# Patient Record
Sex: Male | Born: 1955 | Race: White | Hispanic: No | Marital: Married | Smoking: Never smoker
Health system: Southern US, Community
[De-identification: ages and names within clinical notes are randomized; demographics above are authoritative.]

## PROBLEM LIST (undated history)

## (undated) DIAGNOSIS — N183 Chronic kidney disease, stage 3 (moderate): Secondary | ICD-10-CM

## (undated) DIAGNOSIS — D631 Anemia in chronic kidney disease: Secondary | ICD-10-CM

## (undated) DIAGNOSIS — L03211 Cellulitis of face: Secondary | ICD-10-CM

## (undated) DIAGNOSIS — N189 Chronic kidney disease, unspecified: Secondary | ICD-10-CM

## (undated) DIAGNOSIS — N529 Male erectile dysfunction, unspecified: Secondary | ICD-10-CM

## (undated) DIAGNOSIS — K429 Umbilical hernia without obstruction or gangrene: Secondary | ICD-10-CM

## (undated) DIAGNOSIS — N2581 Secondary hyperparathyroidism of renal origin: Secondary | ICD-10-CM

## (undated) DIAGNOSIS — K219 Gastro-esophageal reflux disease without esophagitis: Secondary | ICD-10-CM

## (undated) DIAGNOSIS — I2510 Atherosclerotic heart disease of native coronary artery without angina pectoris: Secondary | ICD-10-CM

## (undated) DIAGNOSIS — Z8709 Personal history of other diseases of the respiratory system: Secondary | ICD-10-CM

## (undated) DIAGNOSIS — I10 Essential (primary) hypertension: Secondary | ICD-10-CM

## (undated) DIAGNOSIS — Z87442 Personal history of urinary calculi: Secondary | ICD-10-CM

## (undated) DIAGNOSIS — Z85820 Personal history of malignant melanoma of skin: Secondary | ICD-10-CM

## (undated) DIAGNOSIS — N200 Calculus of kidney: Secondary | ICD-10-CM

## (undated) HISTORY — DX: Cellulitis of face: L03.211

## (undated) HISTORY — PX: MOHS SURGERY: SUR867

## (undated) HISTORY — PX: TONSILLECTOMY: SUR1361

## (undated) HISTORY — PX: COLONOSCOPY: SHX174

---

## 2008-12-11 HISTORY — PX: EXTRACORPOREAL SHOCK WAVE LITHOTRIPSY: SHX1557

## 2016-11-14 LAB — HM COLONOSCOPY

## 2017-06-14 NOTE — Progress Notes (Signed)
Subjective:    Patient ID: Joseph Oconnell, male    DOB: [birth year], 61 y.o.   MRN: [id number]  HPI He is here to establish with a new pcp.     H/o melanoma:  He goes to the dermatologist annually and needs to establish with one in the area.   He has a lesion on his left hand that may have been present for about one year.  It is hypopigmented and painful at times.  Rides a bike typically for exercise, but since just moving down here has not been exercising.   He drinks energy drinks and about 2-3 coffee day.   Hypertension: He is taking his medication daily. He is compliant with a low sodium diet.  He denies chest pain, palpitations, edema, shortness of breath and regular headaches. He typically exercises regularly, but not recently.      Medications and allergies reviewed with patient and updated if appropriate.  Patient Active Problem List   Diagnosis Date Noted  . Chronic kidney disease (CKD) 06/16/2017  . History of colon polyps 06/16/2017  . History of melanoma 06/15/2017  . Hypertension 06/15/2017   Allergies as of 06/15/2017   Not on File     Medication List       Accurate as of 06/15/17 11:59 PM. Always use your most recent med list.          amLODipine 2.5 MG tablet Commonly known as:  NORVASC   losartan-hydrochlorothiazide 100-25 MG tablet Commonly known as:  HYZAAR Take 1 tablet by mouth daily.   ranitidine 150 MG tablet Commonly known as:  ZANTAC Take 150 mg by mouth 2 (two) times daily.   XENICAL 120 MG capsule Generic drug:  orlistat take 1 capsule by mouth three times a day WITH EACH MAIN MEAL THAT CONTAINS FAT       Past Medical History:  Diagnosis Date  . Facial cellulitis     Past Surgical History:  Procedure Laterality Date  . MOHS SURGERY     melanoma  . TONSILLECTOMY      Social History   Social History  . Marital status: Married    Spouse name: Diane  . Number of children: 0  . Years of education: N/A   Social History Main  Topics  . Smoking status: Never Smoker  . Smokeless tobacco: Never Used  . Alcohol use Yes     Comment: 2 glasses / day  . Drug use: No  . Sexual activity: Not Asked   Other Topics Concern  . None   Social History Narrative  . None    Family History  Problem Relation Age of Onset  . Breast cancer Mother     Review of Systems  Constitutional: Negative for appetite change, chills, fatigue and fever.  Eyes: Negative for visual disturbance.  Respiratory: Negative for cough, shortness of breath and wheezing.   Cardiovascular: Negative for chest pain, palpitations and leg swelling.  Gastrointestinal: Negative for abdominal pain, blood in stool, constipation, diarrhea and nausea.       Gerd occ  Genitourinary: Negative for dysuria and hematuria.  Musculoskeletal: Positive for arthralgias (hips) and back pain (intermittent lower back).  Skin: Positive for color change.  Neurological: Negative for light-headedness and headaches.  Psychiatric/Behavioral: Negative for dysphoric mood. The patient is not nervous/anxious.        Objective:   Vitals:   06/15/17 1119  BP: 118/80  Pulse: (!) 59  Temp: 98.1 F (36.7 C)  Filed Weights   06/15/17 1119  Weight: 209 lb (94.8 kg)   Body mass index is 28.35 kg/m.  Wt Readings from Last 3 Encounters:  06/15/17 209 lb (94.8 kg)     Physical Exam Constitutional: He appears well-developed and well-nourished. No distress.  HENT:  Head: Normocephalic and atraumatic.  Right Ear: External ear normal.  Left Ear: External ear normal.  Mouth/Throat: Oropharynx is clear and moist.  Normal ear canals and TM b/l  Eyes: Conjunctivae and EOM are normal.  Neck: Neck supple. No tracheal deviation present. No thyromegaly present.  No carotid bruit  Cardiovascular: Normal rate, regular rhythm, normal heart sounds and intact distal pulses.   No murmur heard. Pulmonary/Chest: Effort normal and breath sounds normal. No respiratory distress. He  has no wheezes. He has no rales.  Abdominal: Soft. Bowel sounds are normal. He exhibits no distension. There is no tenderness.  Musculoskeletal: He exhibits no edema.  Lymphadenopathy:   He has no cervical adenopathy.  Skin: Skin is warm and dry. He is not diaphoretic.  Psychiatric: He has a normal mood and affect. His behavior is normal.         Assessment & Plan:   See Problem List for Assessment and Plan of chronic medical problems.

## 2017-06-15 ENCOUNTER — Encounter: Payer: Self-pay | Admitting: Internal Medicine

## 2017-06-15 ENCOUNTER — Ambulatory Visit (INDEPENDENT_AMBULATORY_CARE_PROVIDER_SITE_OTHER): Payer: BLUE CROSS/BLUE SHIELD | Admitting: Internal Medicine

## 2017-06-15 VITALS — BP 118/80 | HR 59 | Temp 98.1°F | Ht 72.0 in | Wt 209.0 lb

## 2017-06-15 DIAGNOSIS — L989 Disorder of the skin and subcutaneous tissue, unspecified: Secondary | ICD-10-CM

## 2017-06-15 DIAGNOSIS — N183 Chronic kidney disease, stage 3 unspecified: Secondary | ICD-10-CM

## 2017-06-15 DIAGNOSIS — Z85820 Personal history of malignant melanoma of skin: Secondary | ICD-10-CM | POA: Diagnosis not present

## 2017-06-15 DIAGNOSIS — I10 Essential (primary) hypertension: Secondary | ICD-10-CM

## 2017-06-15 NOTE — Patient Instructions (Signed)
   Medications reviewed and updated.  No changes recommended at this time.    A referral was ordered for dermatology and nephrology.

## 2017-06-16 ENCOUNTER — Encounter: Payer: Self-pay | Admitting: Internal Medicine

## 2017-06-16 DIAGNOSIS — Z860100 Personal history of colon polyps, unspecified: Secondary | ICD-10-CM

## 2017-06-16 DIAGNOSIS — N183 Chronic kidney disease, stage 3 unspecified: Secondary | ICD-10-CM

## 2017-06-16 DIAGNOSIS — N189 Chronic kidney disease, unspecified: Secondary | ICD-10-CM | POA: Insufficient documentation

## 2017-06-16 HISTORY — DX: Chronic kidney disease, stage 3 unspecified: N18.30

## 2017-06-16 HISTORY — DX: Personal history of colonic polyps: Z86.010

## 2017-06-16 HISTORY — DX: Personal history of colon polyps, unspecified: Z86.0100

## 2017-06-16 NOTE — Assessment & Plan Note (Signed)
BP well controlled Current regimen effective and well tolerated Continue current medications at current doses  

## 2017-06-16 NOTE — Assessment & Plan Note (Signed)
Needs to establish with derm - will refer Has new hypopigmented lesion on left posterior hand, painful at times, present for one year - referred to derm

## 2017-06-16 NOTE — Assessment & Plan Note (Signed)
Has been stable per patient Needs to establish with nephrology - will refer

## 2017-07-11 ENCOUNTER — Telehealth: Payer: Self-pay | Admitting: Emergency Medicine

## 2017-07-11 DIAGNOSIS — N183 Chronic kidney disease, stage 3 unspecified: Secondary | ICD-10-CM

## 2017-07-11 DIAGNOSIS — I10 Essential (primary) hypertension: Secondary | ICD-10-CM

## 2017-07-11 NOTE — Telephone Encounter (Signed)
ordered

## 2017-07-11 NOTE — Telephone Encounter (Signed)
LVM on mobile number with information to come into lab for kidney funct panel.

## 2017-07-11 NOTE — Telephone Encounter (Signed)
-----   Message from Lysle RubensLori D Bowers sent at 07/11/2017  9:02 AM EDT -----  Just following up on the msg below.  ----- Message ----- From: Reuel BoomBowers, Lori D Sent: 07/06/2017   8:27 AM To: Gypsy Loreaylor B Wirt Hemmerich, CMA  Received msg from [facility] regarding pt's referral. Dr. Kathrene BongoGoldsborough wants labs ordered before they can rank pt - BMP or Renal panel.

## 2017-07-11 NOTE — Telephone Encounter (Signed)
Are you okay with ordering these labs, does he need any additional labs?

## 2017-07-20 NOTE — Telephone Encounter (Signed)
Spoke to pt's wife and pt will be coming in 8/14 or 15 to get labs done

## 2017-07-24 ENCOUNTER — Other Ambulatory Visit (INDEPENDENT_AMBULATORY_CARE_PROVIDER_SITE_OTHER): Payer: BLUE CROSS/BLUE SHIELD

## 2017-07-24 DIAGNOSIS — I10 Essential (primary) hypertension: Secondary | ICD-10-CM

## 2017-07-24 DIAGNOSIS — N183 Chronic kidney disease, stage 3 unspecified: Secondary | ICD-10-CM

## 2017-07-24 LAB — COMPREHENSIVE METABOLIC PANEL
ALT: 24 U/L (ref 0–53)
AST: 28 U/L (ref 0–37)
Albumin: 4.4 g/dL (ref 3.5–5.2)
Alkaline Phosphatase: 65 U/L (ref 39–117)
BUN: 19 mg/dL (ref 6–23)
CO2: 31 mEq/L (ref 19–32)
Calcium: 9.2 mg/dL (ref 8.4–10.5)
Chloride: 101 mEq/L (ref 96–112)
Creatinine, Ser: 1.36 mg/dL (ref 0.40–1.50)
GFR: 56.38 mL/min — ABNORMAL LOW (ref >60.00)
Glucose, Bld: 111 mg/dL — ABNORMAL HIGH (ref 70–99)
Potassium: 3.6 mEq/L (ref 3.5–5.1)
Sodium: 138 mEq/L (ref 135–145)
Total Bilirubin: 0.8 mg/dL (ref 0.2–1.2)
Total Protein: 6.7 g/dL (ref 6.0–8.3)

## 2017-08-21 ENCOUNTER — Telehealth: Payer: Self-pay | Admitting: Internal Medicine

## 2017-08-21 MED ORDER — XENICAL 120 MG PO CAPS: ORAL_CAPSULE | ORAL | 3 refills | Status: DC

## 2017-08-21 NOTE — Telephone Encounter (Signed)
Sent electronically.../lmb 

## 2017-08-21 NOTE — Telephone Encounter (Signed)
Requesting script for xenical

## 2017-08-21 NOTE — Telephone Encounter (Signed)
Pls advise since med has not been refill by you before...Joseph Oconnell/lmb

## 2017-08-21 NOTE — Telephone Encounter (Signed)
Ok to fill 

## 2017-08-28 ENCOUNTER — Telehealth: Payer: Self-pay | Admitting: Emergency Medicine

## 2017-08-28 NOTE — Telephone Encounter (Signed)
PA completed for Xenical. KEY [id number]. Awaiting Response.

## 2017-11-19 NOTE — Progress Notes (Signed)
Subjective:    Patient ID: Joseph Oconnell, male    DOB: 12/13/1955, 61 y.o.   MRN: [id number]  HPI He is here for a physical exam.   Right elbow pain:  When he twists his arm in certain ways it hurts.  It started two months ago.  He denies any specific injury.  He thinks it may be a tendon issue.    CKD - he sees his kidney doctor in Jan again.  He has seen derm.    He has no concerns.   Medications and allergies reviewed with patient and updated if appropriate.  Patient Active Problem List   Diagnosis Date Noted  . Chronic kidney disease (CKD) 06/16/2017  . History of colon polyps 06/16/2017  . History of melanoma 06/15/2017  . Hypertension 06/15/2017    Current Outpatient Medications on File Prior to Visit  Medication Sig Dispense Refill  . XENICAL 120 MG capsule take 1 capsule by mouth three times a day WITH EACH MAIN MEAL THAT CONTAINS FAT 90 capsule 3   No current facility-administered medications on file prior to visit.     Past Medical History:  Diagnosis Date  . Facial cellulitis     Past Surgical History:  Procedure Laterality Date  . MOHS SURGERY     melanoma  . TONSILLECTOMY      Social History   Socioeconomic History  . Marital status: Married    Spouse name: Diane  . Number of children: 0  . Years of education: None  . Highest education level: None  Social Needs  . Financial resource strain: None  . Food insecurity - worry: None  . Food insecurity - inability: None  . Transportation needs - medical: None  . Transportation needs - non-medical: None  Occupational History  . None  Tobacco Use  . Smoking status: Never Smoker  . Smokeless tobacco: Never Used  Substance and Sexual Activity  . Alcohol use: Yes    Comment: 2 glasses / day  . Drug use: No  . Sexual activity: None  Other Topics Concern  . None  Social History Narrative  . None    Family History  Problem Relation Age of Onset  . Breast cancer Mother     Review of Systems   Constitutional: Negative for chills and fever.  Eyes: Negative for visual disturbance.  Respiratory: Negative for cough, shortness of breath and wheezing.   Cardiovascular: Negative for chest pain, palpitations and leg swelling.  Gastrointestinal: Negative for abdominal pain, blood in stool, constipation, diarrhea and nausea.  Genitourinary: Negative for difficulty urinating, dysuria and hematuria.  Musculoskeletal: Positive for arthralgias (joint pain, knee pain). Negative for back pain.  Skin: Negative for color change and rash.  Neurological: Negative for dizziness, weakness, light-headedness, numbness and headaches.  Psychiatric/Behavioral: Negative for dysphoric mood. The patient is not nervous/anxious.        Objective:   Vitals:   11/22/17 0901  BP: 130/84  Pulse: 61  Resp: 16  Temp: 98 F (36.7 C)  SpO2: 97%   Filed Weights   11/22/17 0901  Weight: 212 lb (96.2 kg)   Body mass index is 28.75 kg/m.  Wt Readings from Last 3 Encounters:  11/22/17 212 lb (96.2 kg)  06/15/17 209 lb (94.8 kg)     Physical Exam Constitutional: He appears well-developed and well-nourished. No distress.  HENT:  Head: Normocephalic and atraumatic.  Right Ear: External ear normal.  Left Ear: External ear normal.  Mouth/Throat: Oropharynx  is clear and moist.  Normal ear canals and TM b/l  Eyes: Conjunctivae and EOM are normal.  Neck: Neck supple. No tracheal deviation present. No thyromegaly present.  No carotid bruit  Cardiovascular: Normal rate, regular rhythm, normal heart sounds and intact distal pulses.   No murmur heard. Pulmonary/Chest: Effort normal and breath sounds normal. No respiratory distress. He has no wheezes. He has no rales.  Abdominal: Soft. Umbilical hernia - non tender, reducible.  He exhibits no distension. There is no tenderness.  Genitourinary: deferred  Musculoskeletal: He exhibits no edema.  Lymphadenopathy:   He has no cervical adenopathy.  Skin: Skin is  warm and dry. He is not diaphoretic.  Psychiatric: He has a normal mood and affect. His behavior is normal.         Assessment & Plan:   Physical exam: Screening blood work  ordered Immunizations   Td up to date, flu today, on shingles list Colonoscopy  Done 2018 - due in 5 years Eye exams   Up to date  EKG - none on file Exercise  Not regular Weight   Working on weight loss  Skin no concerns, sees derm annually Substance abuse  none  See Problem List for Assessment and Plan of chronic medical problems.    FU in 1 year

## 2017-11-19 NOTE — Patient Instructions (Addendum)
Test(s) ordered today. Your results will be released to MyChart (or called to you) after review, usually within 72hours after test completion. If any changes need to be made, you will be notified at that same time.  All other Health Maintenance issues reviewed.   All recommended immunizations and age-appropriate screenings are up-to-date or discussed.  Flu immunization administered today.     Medications reviewed and updated.  No changes recommended at this time.  Your prescription(s) have been submitted to your pharmacy. Please take as directed and contact our office if you believe you are having problem(s) with the medication(s).  A referral was ordered for urology  Please followup in one year   Health Maintenance, Male A healthy lifestyle and preventive care is important for your health and wellness. Ask your health care provider about what schedule of regular examinations is right for you. What should I know about weight and diet? Eat a Healthy Diet  Eat plenty of vegetables, fruits, whole grains, low-fat dairy products, and lean protein.  Do not eat a lot of foods high in solid fats, added sugars, or salt.  Maintain a Healthy Weight Regular exercise can help you achieve or maintain a healthy weight. You should:  Do at least 150 minutes of exercise each week. The exercise should increase your heart rate and make you sweat (moderate-intensity exercise).  Do strength-training exercises at least twice a week.  Watch Your Levels of Cholesterol and Blood Lipids  Have your blood tested for lipids and cholesterol every 5 years starting at 62 years of age. If you are at high risk for heart disease, you should start having your blood tested when you are 62 years old. You may need to have your cholesterol levels checked more often if: ? Your lipid or cholesterol levels are high. ? You are older than 62 years of age. ? You are at high risk for heart disease.  What should I know about  cancer screening? Many types of cancers can be detected early and may often be prevented. Lung Cancer  You should be screened every year for lung cancer if: ? You are a current smoker who has smoked for at least 30 years. ? You are a former smoker who has quit within the past 15 years.  Talk to your health care provider about your screening options, when you should start screening, and how often you should be screened.  Colorectal Cancer  Routine colorectal cancer screening usually begins at 62 years of age and should be repeated every 5-10 years until you are 62 years old. You may need to be screened more often if early forms of precancerous polyps or small growths are found. Your health care provider may recommend screening at an earlier age if you have risk factors for colon cancer.  Your health care provider may recommend using home test kits to check for hidden blood in the stool.  A small camera at the end of a tube can be used to examine your colon (sigmoidoscopy or colonoscopy). This checks for the earliest forms of colorectal cancer.  Prostate and Testicular Cancer  Depending on your age and overall health, your health care provider may do certain tests to screen for prostate and testicular cancer.  Talk to your health care provider about any symptoms or concerns you have about testicular or prostate cancer.  Skin Cancer  Check your skin from head to toe regularly.  Tell your health care provider about any new moles or changes in  moles, especially if: ? There is a change in a mole's size, shape, or color. ? You have a mole that is larger than a pencil eraser.  Always use sunscreen. Apply sunscreen liberally and repeat throughout the day.  Protect yourself by wearing long sleeves, pants, a wide-brimmed hat, and sunglasses when outside.  What should I know about heart disease, diabetes, and high blood pressure?  If you are 11-100 years of age, have your blood pressure  checked every 3-5 years. If you are 60 years of age or older, have your blood pressure checked every year. You should have your blood pressure measured twice-once when you are at a hospital or clinic, and once when you are not at a hospital or clinic. Record the average of the two measurements. To check your blood pressure when you are not at a hospital or clinic, you can use: ? An automated blood pressure machine at a pharmacy. ? A home blood pressure monitor.  Talk to your health care provider about your target blood pressure.  If you are between 73-75 years old, ask your health care provider if you should take aspirin to prevent heart disease.  Have regular diabetes screenings by checking your fasting blood sugar level. ? If you are at a normal weight and have a low risk for diabetes, have this test once every three years after the age of 66. ? If you are overweight and have a high risk for diabetes, consider being tested at a younger age or more often.  A one-time screening for abdominal aortic aneurysm (AAA) by ultrasound is recommended for men aged 53-75 years who are current or former smokers. What should I know about preventing infection? Hepatitis B If you have a higher risk for hepatitis B, you should be screened for this virus. Talk with your health care provider to find out if you are at risk for hepatitis B infection. Hepatitis C Blood testing is recommended for:  Everyone born from [birth year] through 1966.  Anyone with known risk factors for hepatitis C.  Sexually Transmitted Diseases (STDs)  You should be screened each year for STDs including gonorrhea and chlamydia if: ? You are sexually active and are younger than 62 years of age. ? You are older than 62 years of age and your health care provider tells you that you are at risk for this type of infection. ? Your sexual activity has changed since you were last screened and you are at an increased risk for chlamydia or gonorrhea.  Ask your health care provider if you are at risk.  Talk with your health care provider about whether you are at high risk of being infected with HIV. Your health care provider may recommend a prescription medicine to help prevent HIV infection.  What else can I do?  Schedule regular health, dental, and eye exams.  Stay current with your vaccines (immunizations).  Do not use any tobacco products, such as cigarettes, chewing tobacco, and e-cigarettes. If you need help quitting, ask your health care provider.  Limit alcohol intake to no more than 2 drinks per day. One drink equals 12 ounces of beer, 5 ounces of wine, or 1 ounces of hard liquor.  Do not use street drugs.  Do not share needles.  Ask your health care provider for help if you need support or information about quitting drugs.  Tell your health care provider if you often feel depressed.  Tell your health care provider if you have ever been abused  or do not feel safe at home. This information is not intended to replace advice given to you by your health care provider. Make sure you discuss any questions you have with your health care provider. Document Released: 05/25/2008 Document Revised: 07/26/2016 Document Reviewed: 08/31/2015 Elsevier Interactive Patient Education  [employer].

## 2017-11-22 ENCOUNTER — Telehealth: Payer: Self-pay | Admitting: Internal Medicine

## 2017-11-22 ENCOUNTER — Encounter: Payer: Self-pay | Admitting: Internal Medicine

## 2017-11-22 ENCOUNTER — Ambulatory Visit (INDEPENDENT_AMBULATORY_CARE_PROVIDER_SITE_OTHER): Payer: BLUE CROSS/BLUE SHIELD | Admitting: Internal Medicine

## 2017-11-22 ENCOUNTER — Other Ambulatory Visit (INDEPENDENT_AMBULATORY_CARE_PROVIDER_SITE_OTHER): Payer: BLUE CROSS/BLUE SHIELD

## 2017-11-22 VITALS — BP 130/84 | HR 61 | Temp 98.0°F | Resp 16 | Wt 212.0 lb

## 2017-11-22 DIAGNOSIS — Z125 Encounter for screening for malignant neoplasm of prostate: Secondary | ICD-10-CM

## 2017-11-22 DIAGNOSIS — Z13820 Encounter for screening for osteoporosis: Secondary | ICD-10-CM | POA: Insufficient documentation

## 2017-11-22 DIAGNOSIS — I10 Essential (primary) hypertension: Secondary | ICD-10-CM | POA: Diagnosis not present

## 2017-11-22 DIAGNOSIS — N183 Chronic kidney disease, stage 3 unspecified: Secondary | ICD-10-CM

## 2017-11-22 DIAGNOSIS — Z0001 Encounter for general adult medical examination with abnormal findings: Secondary | ICD-10-CM | POA: Diagnosis not present

## 2017-11-22 DIAGNOSIS — Z85820 Personal history of malignant melanoma of skin: Secondary | ICD-10-CM

## 2017-11-22 DIAGNOSIS — K429 Umbilical hernia without obstruction or gangrene: Secondary | ICD-10-CM

## 2017-11-22 DIAGNOSIS — Z23 Encounter for immunization: Secondary | ICD-10-CM | POA: Diagnosis not present

## 2017-11-22 LAB — LIPID PANEL
Cholesterol: 145 mg/dL (ref 0–200)
HDL: 45.3 mg/dL (ref >39.00)
LDL Cholesterol: 72 mg/dL (ref 0–99)
NonHDL: 99.23
Total CHOL/HDL Ratio: 3
Triglycerides: 134 mg/dL (ref 0.0–149.0)
VLDL: 26.8 mg/dL (ref 0.0–40.0)

## 2017-11-22 LAB — CBC WITH DIFFERENTIAL/PLATELET
Basophils Absolute: 0.1 10*3/uL (ref 0.0–0.1)
Basophils Relative: 0.9 % (ref 0.0–3.0)
Eosinophils Absolute: 0.7 10*3/uL (ref 0.0–0.7)
Eosinophils Relative: 9.2 % — ABNORMAL HIGH (ref 0.0–5.0)
HCT: 43.2 % (ref 39.0–52.0)
Hemoglobin: 15.3 g/dL (ref 13.0–17.0)
Lymphocytes Relative: 36.6 % (ref 12.0–46.0)
Lymphs Abs: 2.9 10*3/uL (ref 0.7–4.0)
MCHC: 35.4 g/dL (ref 30.0–36.0)
MCV: 99.4 fl (ref 78.0–100.0)
Monocytes Absolute: 0.7 10*3/uL (ref 0.1–1.0)
Monocytes Relative: 9.1 % (ref 3.0–12.0)
Neutro Abs: 3.5 10*3/uL (ref 1.4–7.7)
Neutrophils Relative %: 44.2 % (ref 43.0–77.0)
Platelets: 143 10*3/uL — ABNORMAL LOW (ref 150.0–400.0)
RBC: 4.34 Mil/uL (ref 4.22–5.81)
RDW: 13.3 % (ref 11.5–15.5)
WBC: 7.9 10*3/uL (ref 4.0–10.5)

## 2017-11-22 LAB — COMPREHENSIVE METABOLIC PANEL
ALT: 26 U/L (ref 0–53)
AST: 24 U/L (ref 0–37)
Albumin: 4.2 g/dL (ref 3.5–5.2)
Alkaline Phosphatase: 56 U/L (ref 39–117)
BUN: 17 mg/dL (ref 6–23)
CO2: 31 mEq/L (ref 19–32)
Calcium: 8.9 mg/dL (ref 8.4–10.5)
Chloride: 104 mEq/L (ref 96–112)
Creatinine, Ser: 1.26 mg/dL (ref 0.40–1.50)
GFR: 61.51 mL/min (ref >60.00)
Glucose, Bld: 92 mg/dL (ref 70–99)
Potassium: 3.9 mEq/L (ref 3.5–5.1)
Sodium: 140 mEq/L (ref 135–145)
Total Bilirubin: 0.6 mg/dL (ref 0.2–1.2)
Total Protein: 7.2 g/dL (ref 6.0–8.3)

## 2017-11-22 LAB — TSH: TSH: 2.99 u[IU]/mL (ref 0.35–4.50)

## 2017-11-22 MED ORDER — AMLODIPINE BESYLATE 2.5 MG PO TABS: 2.5000 mg | ORAL_TABLET | Freq: Every day | ORAL | 3 refills | Status: DC

## 2017-11-22 MED ORDER — LOSARTAN POTASSIUM-HCTZ 100-25 MG PO TABS: 1.0000 | ORAL_TABLET | Freq: Every day | ORAL | 3 refills | Status: DC

## 2017-11-22 NOTE — Telephone Encounter (Signed)
I discussed this with pt and wife at discharge. Does a referral need to be entered for opthalmology

## 2017-11-22 NOTE — Assessment & Plan Note (Signed)
W/o symptoms No referral to surgery needed - will monitor

## 2017-11-22 NOTE — Assessment & Plan Note (Signed)
Up to date with dermatology visits  ?

## 2017-11-22 NOTE — Telephone Encounter (Signed)
Eye referral not needed - they can make their own appt.  [facility]rote done [facility]  On his wife's pt instructions as a good practice to check into

## 2017-11-22 NOTE — Telephone Encounter (Addendum)
FYI Patient states he was to be referred for urologist and opthalmology.

## 2017-11-22 NOTE — Telephone Encounter (Signed)
Advised pts wife to look back at the AVS for necessary information.

## 2017-11-22 NOTE — Assessment & Plan Note (Signed)
Following with [facility]el at [facility] cmp

## 2017-11-22 NOTE — Assessment & Plan Note (Signed)
Has CKD Concerned over bones - will order dexa Will pay for exam if not covered

## 2017-11-22 NOTE — Assessment & Plan Note (Signed)
BP well controlled Current regimen effective and well tolerated Continue current medications at current doses cmp  

## 2017-11-23 LAB — PSA, TOTAL AND FREE
PSA, % Free: 50 % (calc) (ref >25)
PSA, Free: 0.4 ng/mL
PSA, Total: 0.8 ng/mL (ref <=4.0)

## 2017-12-25 ENCOUNTER — Ambulatory Visit (INDEPENDENT_AMBULATORY_CARE_PROVIDER_SITE_OTHER): Payer: BLUE CROSS/BLUE SHIELD | Admitting: *Deleted

## 2017-12-25 ENCOUNTER — Ambulatory Visit (INDEPENDENT_AMBULATORY_CARE_PROVIDER_SITE_OTHER)
Admission: RE | Admit: 2017-12-25 | Discharge: 2017-12-25 | Disposition: A | Discharge status: Home / Self Care | Payer: BLUE CROSS/BLUE SHIELD | Source: Ambulatory Visit | Attending: Internal Medicine | Admitting: Internal Medicine

## 2017-12-25 DIAGNOSIS — Z23 Encounter for immunization: Secondary | ICD-10-CM

## 2017-12-25 DIAGNOSIS — Z13820 Encounter for screening for osteoporosis: Secondary | ICD-10-CM

## 2017-12-26 NOTE — Progress Notes (Signed)
[name] [address] [place], [place] [address] Phone: [phone] Subjective:    I'm seeing this patient by the request  of:  Pincus Sanes, MD   CC: right arm pain and knee pain   BJY:[id number]  Joseph Oconnell is a 62 y.o. male coming in with complaint of right arm and knee pain. States he tore his lateral meniscus years ago (1978). He says that his elbow feels like something is disconnected.  Elbow -Twisted it while moving - Pain radiates up arm -Plays golf  Onset- 2- 3 months ago Location- lateral Character- Sharp Aggravating factors- lifting (supinates to compensate)  Reliving factors-rest Therapies tried-  Severity-6 out of 10  Knee -Swelling -Twisting it wrong increases pain  Onset- Chronic Location- Lateral Duration-intermittent Character- Dull, achy, heavy Aggravating factors- climbing steps,  Reliving factors-time Therapies tried-nothing Severity-5 out of 10    Past Medical History:  Diagnosis Date  . Facial cellulitis    Past Surgical History:  Procedure Laterality Date  . MOHS SURGERY     melanoma  . TONSILLECTOMY     Social History   Socioeconomic History  . Marital status: Married    Spouse name: Joseph Oconnell  . Number of children: 0  . Years of education: None  . Highest education level: None  Social Needs  . Financial resource strain: None  . Food insecurity - worry: None  . Food insecurity - inability: None  . Transportation needs - medical: None  . Transportation needs - non-medical: None  Occupational History  . None  Tobacco Use  . Smoking status: Never Smoker  . Smokeless tobacco: Never Used  Substance and Sexual Activity  . Alcohol use: Yes    Comment: 2 glasses / day  . Drug use: No  . Sexual activity: None  Other Topics Concern  . None  Social History Narrative  . None   Not on File Family History  Problem Relation Age of Onset  . Breast cancer Mother      Past medical history,  social, surgical and family history all reviewed in electronic medical record.  No pertanent information unless stated regarding to the chief complaint.   Review of Systems:Review of systems updated and as accurate as of 12/27/17  No headache, visual changes, nausea, vomiting, diarrhea, constipation, dizziness, abdominal pain, skin rash, fevers, chills, night sweats, weight loss, swollen lymph nodes, body aches, joint swelling, chest pain, shortness of breath, mood changes.  Positive muscle aches  Objective  Blood pressure 118/80, pulse 75, height 6' (1.829 m), weight 209 lb (94.8 kg), SpO2 98 %. Systems examined below as of 12/27/17   General: No apparent distress alert and oriented x3 mood and affect normal, dressed appropriately.  HEENT: Pupils equal, extraocular movements intact  Respiratory: Patient's speak in full sentences and does not appear short of breath  Cardiovascular: No lower extremity edema, non tender, no erythema  Skin: Warm dry intact with no signs of infection or rash on extremities or on axial skeleton.  Abdomen: Soft nontender  Neuro: Cranial nerves II through XII are intact, neurovascularly intact in all extremities with 2+ DTRs and 2+ pulses.  Lymph: No lymphadenopathy of posterior or anterior cervical chain or axillae bilaterally.  Gait normal with good balance and coordination.  MSK:  Non tender with full range of motion and good stability and symmetric strength and tone of shoulders, wrist, hip,  and ankles bilaterally.  Elbow: Right Unremarkable to inspection. Range of motion full  pronation, supination, flexion, extension. Strength is full to all of the above directions Stable to varus, valgus stress. Negative moving valgus stress test. Tender over the lateral epicondylar region. Ulnar nerve does not sublux. Negative cubital tunnel Tinel's.  Knee: Right Normal to inspection with no erythema or effusion or obvious bony abnormalities. Palpation normal with no  warmth, joint line tenderness, patellar tenderness, or condyle tenderness. ROM full in flexion and extension and lower leg rotation. Ligaments with solid consistent endpoints including ACL, PCL, LCL, MCL. Negative Mcmurray's, Apley's, and Thessalonian tests. Mild painful patellar compression. Patellar glide to moderate crepitus. Patellar and quadriceps tendons unremarkable. Hamstring and quadriceps strength is normal.  Musculoskeletal ultrasound was performed and interpreted by Terrilee FilesZach Beverly Suriano D.O.   Elbow:  Lateral epicondyle and common extensor tendon origin visualized.  Chronic avulsion noted with increasing Doppler flow and hypoechoic changes.  Patient's medial epicondylar region is unremarkable.  Radial head is unremarkable.Marland Kitchen.  IMPRESSION: Avulsion fracture of the lateral epicondylar region  [address]; 15 additional minutes spent for Therapeutic exercises as stated in above notes.  This included exercises focusing on stretching, strengthening, with significant focus on eccentric aspects.   Long term goals include an improvement in range of motion, strength, endurance as well as avoiding reinjury. Patient's frequency would include in 1-2 times a day, 3-5 times a week for a duration of 6-12 weeks. Patellofemoral Syndrome  Reviewed anatomy using anatomical model and how PFS occurs.  Given rehab exercises handout for VMO, hip abductors, core, entire kinetic chain including proprioception exercises including cone touches, step downs, hip elevations and turn outs.  Could benefit from PT, regular exercise, upright biking, and a PFS knee brace to assist with tracking abnormalities.  Proper technique shown and discussed handout in great detail with ATC.  All questions were discussed and answered.     Impression and Recommendations:     This case required medical decision making of moderate complexity.      Note: This dictation was prepared with Dragon dictation along with smaller phrase  technology. Any transcriptional errors that result from this process are unintentional.

## 2017-12-27 ENCOUNTER — Ambulatory Visit: Payer: BLUE CROSS/BLUE SHIELD | Admitting: Family Medicine

## 2017-12-27 ENCOUNTER — Ambulatory Visit: Payer: Self-pay

## 2017-12-27 ENCOUNTER — Encounter: Payer: Self-pay | Admitting: Family Medicine

## 2017-12-27 ENCOUNTER — Other Ambulatory Visit: Payer: Self-pay

## 2017-12-27 VITALS — BP 118/80 | HR 75 | Ht 72.0 in | Wt 209.0 lb

## 2017-12-27 DIAGNOSIS — M25521 Pain in right elbow: Secondary | ICD-10-CM | POA: Diagnosis not present

## 2017-12-27 DIAGNOSIS — M1711 Unilateral primary osteoarthritis, right knee: Secondary | ICD-10-CM | POA: Diagnosis not present

## 2017-12-27 DIAGNOSIS — S42434A Nondisplaced fracture (avulsion) of lateral epicondyle of right humerus, initial encounter for closed fracture: Secondary | ICD-10-CM

## 2017-12-27 DIAGNOSIS — Z13820 Encounter for screening for osteoporosis: Secondary | ICD-10-CM | POA: Diagnosis not present

## 2017-12-27 DIAGNOSIS — S42433A Displaced fracture (avulsion) of lateral epicondyle of unspecified humerus, initial encounter for closed fracture: Secondary | ICD-10-CM

## 2017-12-27 HISTORY — DX: Unilateral primary osteoarthritis, right knee: M17.11

## 2017-12-27 HISTORY — DX: Displaced fracture (avulsion) of lateral epicondyle of unspecified humerus, initial encounter for closed fracture: S42.433A

## 2017-12-27 MED ORDER — DICLOFENAC SODIUM 2 % TD SOLN: 2.0000 g | Freq: Two times a day (BID) | TRANSDERMAL | 3 refills | Status: DC

## 2017-12-27 MED ORDER — VITAMIN D (ERGOCALCIFEROL) 1.25 MG (50000 UNIT) PO CAPS: 50000.0000 [IU] | ORAL_CAPSULE | ORAL | 0 refills | Status: DC

## 2017-12-27 NOTE — Patient Instructions (Signed)
Good to see you.  Ice 20 minutes 2 times daily. Usually after activity and before bed. Exercises 3 times a week. But start in a week  Wrist brace day and night for 2 weeks then nightly for 2 weeks.  Once weekly vitamin D for 12 weeks.  pennsaid pinkie amount topically 2 times daily as needed.   For the knee biking would be great  Look up exercises for VMO strengthening  See me again in 4 weeks

## 2017-12-27 NOTE — Assessment & Plan Note (Signed)
Patellofemoral Syndrome  Reviewed anatomy using anatomical model and how PFS occurs.  Given rehab exercises handout for VMO, hip abductors, core, entire kinetic chain including proprioception exercises including cone touches, step downs, hip elevations and turn outs.  Could benefit from PT, regular exercise, upright biking, and a PFS knee brace to assist with tracking abnormalities. Likely underlying arthritis as well.

## 2017-12-27 NOTE — Assessment & Plan Note (Signed)
Patient does have a nonhealing epicondylar fracture.  Given home exercises, icing regimen on anti-inflammatories and once weekly vitamin D.  Will avoid wrist extension.  Follow-up again in 4 weeks.

## 2018-01-23 NOTE — Progress Notes (Signed)
Joseph Oconnell D.O. [facility] [address] [place], [place] [address] Phone: [phone] Subjective:     CC: Joseph Oconnell  QMV:[id number]:Subjective  Joseph SidleDavid Oconnell is a 62 y.o. male coming in with complaint of knee and Joseph pain.   His knee is feeling better than last visit.   His Joseph was feeling better but his dog jumped up on him and he feels that he re-injured his Joseph. He also notes pain in his bicep. His pain is intermittent and occurs if he moves his wrist.  Patient wants to make sure that he does not have another tear.  Patient states that he is feeling significantly better.      Past Medical History:  Diagnosis Date  . Facial cellulitis    Past Surgical History:  Procedure Laterality Date  . MOHS SURGERY     melanoma  . TONSILLECTOMY     Social History   Socioeconomic History  . Marital status: Married    Spouse name: Joseph Oconnell  . Number of children: 0  . Years of education: None  . Highest education level: None  Social Needs  . Financial resource strain: None  . Food insecurity - worry: None  . Food insecurity - inability: None  . Transportation needs - medical: None  . Transportation needs - non-medical: None  Occupational History  . None  Tobacco Use  . Smoking status: Never Smoker  . Smokeless tobacco: Never Used  Substance and Sexual Activity  . Alcohol use: Yes    Comment: 2 glasses / day  . Drug use: No  . Sexual activity: None  Other Topics Concern  . None  Social History Narrative  . None   Not on File Family History  Problem Relation Age of Onset  . Breast cancer Mother      Past medical history, social, surgical and family history all reviewed in electronic medical record.  No pertanent information unless stated regarding to the chief complaint.   Review of Systems:Review of systems updated and as accurate as of 01/24/18  No headache, visual changes, nausea, vomiting, diarrhea, constipation, dizziness, abdominal pain, skin  rash, fevers, chills, night sweats, weight loss, swollen lymph nodes, body aches, joint swelling, muscle aches, chest pain, shortness of breath, mood changes.   Objective  Blood pressure (!) 132/92, pulse 77, height 6' (1.829 m), weight 208 lb (94.3 kg), SpO2 98 %. Systems examined below as of 01/24/18   General: No apparent distress alert and oriented x3 mood and affect normal, dressed appropriately.  HEENT: Pupils equal, extraocular movements intact  Respiratory: Patient's speak in full sentences and does not appear short of breath  Cardiovascular: No lower extremity edema, non tender, no erythema  Skin: Warm dry intact with no signs of infection or rash on extremities or on axial skeleton.  Abdomen: Soft nontender  Neuro: Cranial nerves II through XII are intact, neurovascularly intact in all extremities with 2+ DTRs and 2+ pulses.  Lymph: No lymphadenopathy of posterior or anterior cervical chain or axillae bilaterally.  Gait normal with good balance and coordination.  MSK:  Non tender with full range of motion and good stability and symmetric strength and tone of shoulders, , wrist, hip, knee and ankles bilaterally.  Joseph: Right Unremarkable to inspection. Range of motion full pronation, supination, flexion, extension. Strength is full to all of the above directions Stable to varus, valgus stress. Negative moving valgus stress test. Very mild discomfort over the lateral epicondylar region Ulnar nerve does  not sublux. Negative cubital tunnel Tinel's. Contralateral Joseph unremarkable  Musculoskeletal ultrasound was performed and interpreted by Terrilee Files D.O.   Joseph:  Lateral epicondyle and common extensor tendon origin visualized.  Shows a very small partial tear noted.  Patient no avulsion that was seen previously is well healing at this time.  IMPRESSION: Interval healing of lateral epicondylitis   Impression and Recommendations:     This case required medical decision  making of moderate complexity.      Note: This dictation was prepared with Dragon dictation along with smaller phrase technology. Any transcriptional errors that result from this process are unintentional.

## 2018-01-24 ENCOUNTER — Ambulatory Visit: Payer: BLUE CROSS/BLUE SHIELD | Admitting: Family Medicine

## 2018-01-24 ENCOUNTER — Encounter: Payer: Self-pay | Admitting: Family Medicine

## 2018-01-24 ENCOUNTER — Ambulatory Visit: Payer: Self-pay

## 2018-01-24 VITALS — BP 132/92 | HR 77 | Ht 72.0 in | Wt 208.0 lb

## 2018-01-24 DIAGNOSIS — S42434D Nondisplaced fracture (avulsion) of lateral epicondyle of right humerus, subsequent encounter for fracture with routine healing: Secondary | ICD-10-CM

## 2018-01-24 DIAGNOSIS — M25521 Pain in right elbow: Secondary | ICD-10-CM

## 2018-01-24 NOTE — Patient Instructions (Signed)
4 weeks

## 2018-01-24 NOTE — Assessment & Plan Note (Signed)
Avulsion seems to be healing.  Discussed continuing the vitamin D, icing regimen, bracing for 1 week.  Patient is to start considering the possibility of plain golf again in the near future.  Patient will come back in 4 weeks if not completely resolved

## 2018-02-21 ENCOUNTER — Ambulatory Visit: Payer: BLUE CROSS/BLUE SHIELD | Admitting: Family Medicine

## 2018-02-25 ENCOUNTER — Ambulatory Visit: Payer: BLUE CROSS/BLUE SHIELD

## 2018-03-12 NOTE — Progress Notes (Signed)
Joseph ScaleZach Shayne Oconnell D.O. [facility] [address] [place], [place] [address] Phone: [phone] Subjective:      CC: Right elbow pain follow-up  NUU:[id number]:Subjective  Joseph SidleDavid Oconnell is a 62 y.o. male coming in with complaint of right elbow pain. He is having pain up into the tricep. He continues to have sharp pain with "twisting." He has been doing a lot of yard work.  Seems to be more posterior than lateral now.  A little bit different.  Not severe.     Past Medical History:  Diagnosis Date  . Facial cellulitis    Past Surgical History:  Procedure Laterality Date  . MOHS SURGERY     melanoma  . TONSILLECTOMY     Social History   Socioeconomic History  . Marital status: Married    Spouse name: Diane  . Number of children: 0  . Years of education: Not on file  . Highest education level: Not on file  Occupational History  . Not on file  Social Needs  . Financial resource strain: Not on file  . Food insecurity:    Worry: Not on file    Inability: Not on file  . Transportation needs:    Medical: Not on file    Non-medical: Not on file  Tobacco Use  . Smoking status: Never Smoker  . Smokeless tobacco: Never Used  Substance and Sexual Activity  . Alcohol use: Yes    Comment: 2 glasses / day  . Drug use: No  . Sexual activity: Not on file  Lifestyle  . Physical activity:    Days per week: Not on file    Minutes per session: Not on file  . Stress: Not on file  Relationships  . Social connections:    Talks on phone: Not on file    Gets together: Not on file    Attends religious service: Not on file    Active member of club or organization: Not on file    Attends meetings of clubs or organizations: Not on file    Relationship status: Not on file  Other Topics Concern  . Not on file  Social History Narrative  . Not on file   Not on File Family History  Problem Relation Age of Onset  . Breast cancer Mother      Past medical history, social, surgical and  family history all reviewed in electronic medical record.  No pertanent information unless stated regarding to the chief complaint.   Review of Systems:Review of systems updated and as accurate as of 03/13/18  No headache, visual changes, nausea, vomiting, diarrhea, constipation, dizziness, abdominal pain, skin rash, fevers, chills, night sweats, weight loss, swollen lymph nodes, body aches, joint swelling, muscle aches, chest pain, shortness of breath, mood changes.   Objective  Blood pressure (!) 138/98, pulse 75, height 6' (1.829 m), weight 202 lb (91.6 kg), SpO2 98 %. Systems examined below as of 03/13/18   General: No apparent distress alert and oriented x3 mood and affect normal, dressed appropriately.  HEENT: Pupils equal, extraocular movements intact  Respiratory: Patient's speak in full sentences and does not appear short of breath  Cardiovascular: No lower extremity edema, non tender, no erythema  Skin: Warm dry intact with no signs of infection or rash on extremities or on axial skeleton.  Abdomen: Soft nontender  Neuro: Cranial nerves II through XII are intact, neurovascularly intact in all extremities with 2+ DTRs and 2+ pulses.  Lymph: No lymphadenopathy of posterior  or anterior cervical chain or axillae bilaterally.  Gait normal with good balance and coordination.  MSK:  Non tender with full range of motion and good stability and symmetric strength and tone of shoulders, wrist, hip, knee and ankles bilaterally.  Elbow: Right Unremarkable to inspection. Range of motion full pronation, supination, flexion, extension. Strength is full to all of the above directions Stable to varus, valgus stress. Negative moving valgus stress test. No discrete areas of tenderness to palpation. Ulnar nerve does not sublux. Negative cubital tunnel Tinel's. Contralateral elbow unremarkable  Musculoskeletal ultrasound was performed and interpreted by Terrilee Files D.O.   Elbow:  O.  Seems to have  significant improvement.  Bone seems to be showing reabsorption where avulsion used to be.  Still some mild increased Doppler flow.  Patient does have enlargement of the radial nerve.  IMPRESSION:  NORMAL ULTRASONOGRAPHIC EXAMINATION OF THE ELBOW.    Impression and Recommendations:     This case required medical decision making of moderate complexity.      Note: This dictation was prepared with Dragon dictation along with smaller phrase technology. Any transcriptional errors that result from this process are unintentional.

## 2018-03-13 ENCOUNTER — Ambulatory Visit: Payer: BLUE CROSS/BLUE SHIELD | Admitting: Family Medicine

## 2018-03-13 ENCOUNTER — Ambulatory Visit: Payer: Self-pay

## 2018-03-13 ENCOUNTER — Ambulatory Visit: Payer: BLUE CROSS/BLUE SHIELD

## 2018-03-13 ENCOUNTER — Ambulatory Visit (INDEPENDENT_AMBULATORY_CARE_PROVIDER_SITE_OTHER): Payer: BLUE CROSS/BLUE SHIELD

## 2018-03-13 ENCOUNTER — Encounter: Payer: Self-pay | Admitting: Family Medicine

## 2018-03-13 VITALS — BP 138/98 | HR 75 | Ht 72.0 in | Wt 202.0 lb

## 2018-03-13 DIAGNOSIS — M25521 Pain in right elbow: Secondary | ICD-10-CM

## 2018-03-13 DIAGNOSIS — S42434D Nondisplaced fracture (avulsion) of lateral epicondyle of right humerus, subsequent encounter for fracture with routine healing: Secondary | ICD-10-CM

## 2018-03-13 DIAGNOSIS — Z299 Encounter for prophylactic measures, unspecified: Secondary | ICD-10-CM

## 2018-03-13 MED ORDER — GABAPENTIN 100 MG PO CAPS: 200.0000 mg | ORAL_CAPSULE | Freq: Every day | ORAL | 3 refills | Status: DC

## 2018-03-13 NOTE — Patient Instructions (Addendum)
Good to see you  Joseph Oconnell is your friend.  Gabapentin 200mg  at night Stay active though  Arm compression sleeve from [employer] with activity could help  pennsaid pinkie amount topically 2 times daily as needed.   See me again in 4 weeks

## 2018-03-13 NOTE — Assessment & Plan Note (Signed)
Continues to show improvement healing with what appears to be a sequelae with enlargement of the radial nerve proximal to the elbow area.  No masses appreciated.  Discussed icing regimen and home exercises.  Discussed which activities of doing which wants to avoid.  Follow-up again 4-6 weeks and if not improved consider injection

## 2018-04-09 ENCOUNTER — Other Ambulatory Visit: Payer: Self-pay | Admitting: Family Medicine

## 2018-04-09 NOTE — Telephone Encounter (Signed)
Refill done.  

## 2018-04-12 ENCOUNTER — Telehealth: Payer: Self-pay | Admitting: Internal Medicine

## 2018-04-12 ENCOUNTER — Ambulatory Visit: Payer: BLUE CROSS/BLUE SHIELD | Admitting: Family Medicine

## 2018-04-12 ENCOUNTER — Other Ambulatory Visit: Payer: Self-pay | Admitting: Emergency Medicine

## 2018-04-12 MED ORDER — XENICAL 120 MG PO CAPS: ORAL_CAPSULE | ORAL | 1 refills | Status: DC

## 2018-04-12 NOTE — Telephone Encounter (Signed)
Copied from CRM [id number]. Topic: Quick Communication - Rx Refill/Question >> Apr 12, 2018 10:22 AM Crist Infante wrote: Reason for CRM: [facility] and any request for them is ok.  Medication: XENICAL 120 MG capsule 90 day   pt will eventually [facility]  [facility] - [place], [place] - [address] [phone] (Phone) [phone] (Fax)

## 2018-04-17 MED ORDER — XENICAL 120 MG PO CAPS: ORAL_CAPSULE | ORAL | 1 refills | Status: DC

## 2018-04-17 NOTE — Telephone Encounter (Signed)
[facility]../l;mb

## 2018-04-17 NOTE — Addendum Note (Signed)
Addended by: Deatra James on: 04/17/2018 01:55 PM   Modules accepted: Orders

## 2018-05-20 ENCOUNTER — Other Ambulatory Visit: Payer: Self-pay | Admitting: Internal Medicine

## 2018-07-02 ENCOUNTER — Other Ambulatory Visit: Payer: Self-pay | Admitting: Internal Medicine

## 2018-07-03 ENCOUNTER — Other Ambulatory Visit: Payer: Self-pay | Admitting: Internal Medicine

## 2018-07-03 NOTE — Telephone Encounter (Signed)
Copied from CRM [id number]. Topic: Quick Communication - Rx Refill/Question >> Jul 03, 2018 10:51 AM Alexander BergeronBarksdale, Joseph B wrote: Medication: XENICAL 120 MG capsule [[id number]][[id number]]   Has the patient contacted their pharmacy? Yes.   (Agent: If no, request that the patient contact the pharmacy for the refill.) (Agent: If yes, when and what did the pharmacy advise?)  Preferred Pharm[facility] (with phone number [facility] street [facility]   Agent: Please be advised that RX refills may take up to 3 business days. We ask that you follow-up with your pharmacy.

## 2018-07-04 MED ORDER — XENICAL 120 MG PO CAPS: ORAL_CAPSULE | ORAL | 1 refills | Status: DC

## 2018-07-04 NOTE — Telephone Encounter (Signed)
Reviewed chart pt is up-to-date [facility].../lmb  

## 2018-07-04 NOTE — Telephone Encounter (Signed)
Xenical refill Last OV:11/22/17 Last refill:05/20/18 270 cap/1 refill UJW:JXBJYPCP:[facility] [place] [facility] [address] [phone] (Phone) [phone] (Fax)

## 2018-07-16 ENCOUNTER — Other Ambulatory Visit: Payer: Self-pay | Admitting: Family Medicine

## 2018-07-17 ENCOUNTER — Telehealth: Payer: Self-pay | Admitting: Internal Medicine

## 2018-07-17 NOTE — Telephone Encounter (Signed)
Copied from CRM [id number]. Topic: Quick Communication - See Telephone Encounter >> Jul 17, 2018  4:39 PM Lorrine KinMcGee, Lacheryl Niesen B, NT wrote: CRM for notification. See Telephone encounter for: 07/17/18.Patient calling and states a PA is needed for orlistat (XENICAL) 120 MG capsule [facility]: [phone] Code expires 09/10/18

## 2018-07-18 ENCOUNTER — Other Ambulatory Visit: Payer: Self-pay | Admitting: Family Medicine

## 2018-07-22 MED ORDER — XENICAL 120 MG PO CAPS: ORAL_CAPSULE | ORAL | 2 refills | Status: DC

## 2018-07-22 NOTE — Telephone Encounter (Signed)
[facility], was given Key to complete PA. Error message kept appearing. Contacted pt's wife, states that his PA is not due until Oct. [facility]

## 2018-08-02 ENCOUNTER — Encounter: Payer: Self-pay | Admitting: Internal Medicine

## 2018-08-21 ENCOUNTER — Other Ambulatory Visit: Payer: Self-pay | Admitting: Family Medicine

## 2018-10-10 ENCOUNTER — Other Ambulatory Visit: Payer: Self-pay | Admitting: Family Medicine

## 2018-12-20 ENCOUNTER — Encounter: Payer: BLUE CROSS/BLUE SHIELD | Admitting: Internal Medicine

## 2018-12-25 ENCOUNTER — Other Ambulatory Visit: Payer: Self-pay | Admitting: Internal Medicine

## 2018-12-31 NOTE — Patient Instructions (Addendum)
Tests ordered today. Your results will be released to MyChart (or called to you) after review, usually within 72hours after test completion. If any changes need to be made, you will be notified at that same time.  All other Health Maintenance issues reviewed.   All recommended immunizations and age-appropriate screenings are up-to-date or discussed.  Flu immunization administered today.    Medications reviewed and updated.  Changes include :   Try amlactin lotion on your feet  Your prescription(s) have been submitted to your pharmacy. Please take as directed and contact our office if you believe you are having problem(s) with the medication(s).   Please followup in one year    Health Maintenance, Male A healthy lifestyle and preventive care is important for your health and wellness. Ask your health care provider about what schedule of regular examinations is right for you. What should I know about weight and diet? Eat a Healthy Diet  Eat plenty of vegetables, fruits, whole grains, low-fat dairy products, and lean protein.  Do not eat a lot of foods high in solid fats, added sugars, or salt.  Maintain a Healthy Weight Regular exercise can help you achieve or maintain a healthy weight. You should:  Do at least 150 minutes of exercise each week. The exercise should increase your heart rate and make you sweat (moderate-intensity exercise).  Do strength-training exercises at least twice a week. Watch Your Levels of Cholesterol and Blood Lipids  Have your blood tested for lipids and cholesterol every 5 years starting at 64 years of age. If you are at high risk for heart disease, you should start having your blood tested when you are 64 years old. You may need to have your cholesterol levels checked more often if: ? Your lipid or cholesterol levels are high. ? You are older than 64 years of age. ? You are at high risk for heart disease. What should I know about cancer screening? Many  types of cancers can be detected early and may often be prevented. Lung Cancer  You should be screened every year for lung cancer if: ? You are a current smoker who has smoked for at least 30 years. ? You are a former smoker who has quit within the past 15 years.  Talk to your health care provider about your screening options, when you should start screening, and how often you should be screened. Colorectal Cancer  Routine colorectal cancer screening usually begins at 64 years of age and should be repeated every 5-10 years until you are 64 years old. You may need to be screened more often if early forms of precancerous polyps or small growths are found. Your health care provider may recommend screening at an earlier age if you have risk factors for colon cancer.  Your health care provider may recommend using home test kits to check for hidden blood in the stool.  A small camera at the end of a tube can be used to examine your colon (sigmoidoscopy or colonoscopy). This checks for the earliest forms of colorectal cancer. Prostate and Testicular Cancer  Depending on your age and overall health, your health care provider may do certain tests to screen for prostate and testicular cancer.  Talk to your health care provider about any symptoms or concerns you have about testicular or prostate cancer. Skin Cancer  Check your skin from head to toe regularly.  Tell your health care provider about any new moles or changes in moles, especially if: ? There is  a change in a mole's size, shape, or color. ? You have a mole that is larger than a pencil eraser.  Always use sunscreen. Apply sunscreen liberally and repeat throughout the day.  Protect yourself by wearing long sleeves, pants, a wide-brimmed hat, and sunglasses when outside. What should I know about heart disease, diabetes, and high blood pressure?  If you are 36-59 years of age, have your blood pressure checked every 3-5 years. If you are  19 years of age or older, have your blood pressure checked every year. You should have your blood pressure measured twice-once when you are at a hospital or clinic, and once when you are not at a hospital or clinic. Record the average of the two measurements. To check your blood pressure when you are not at a hospital or clinic, you can use: ? An automated blood pressure machine at a pharmacy. ? A home blood pressure monitor.  Talk to your health care provider about your target blood pressure.  If you are between 43-31 years old, ask your health care provider if you should take aspirin to prevent heart disease.  Have regular diabetes screenings by checking your fasting blood sugar level. ? If you are at a normal weight and have a low risk for diabetes, have this test once every three years after the age of 81. ? If you are overweight and have a high risk for diabetes, consider being tested at a younger age or more often.  A one-time screening for abdominal aortic aneurysm (AAA) by ultrasound is recommended for men aged 65-75 years who are current or former smokers. What should I know about preventing infection? Hepatitis B If you have a higher risk for hepatitis B, you should be screened for this virus. Talk with your health care provider to find out if you are at risk for hepatitis B infection. Hepatitis C Blood testing is recommended for:  Everyone born from [birth year] through 1966.  Anyone with known risk factors for hepatitis C. Sexually Transmitted Diseases (STDs)  You should be screened each year for STDs including gonorrhea and chlamydia if: ? You are sexually active and are younger than 64 years of age. ? You are older than 64 years of age and your health care provider tells you that you are at risk for this type of infection. ? Your sexual activity has changed since you were last screened and you are at an increased risk for chlamydia or gonorrhea. Ask your health care provider if you  are at risk.  Talk with your health care provider about whether you are at high risk of being infected with HIV. Your health care provider may recommend a prescription medicine to help prevent HIV infection. What else can I do?  Schedule regular health, dental, and eye exams.  Stay current with your vaccines (immunizations).  Do not use any tobacco products, such as cigarettes, chewing tobacco, and e-cigarettes. If you need help quitting, ask your health care provider.  Limit alcohol intake to no more than 2 drinks per day. One drink equals 12 ounces of beer, 5 ounces of wine, or 1 ounces of hard liquor.  Do not use street drugs.  Do not share needles.  Ask your health care provider for help if you need support or information about quitting drugs.  Tell your health care provider if you often feel depressed.  Tell your health care provider if you have ever been abused or do not feel safe at home. This information  is not intended to replace advice given to you by your health care provider. Make sure you discuss any questions you have with your health care provider. Document Released: 05/25/2008 Document Revised: 07/26/2016 Document Reviewed: 08/31/2015 Elsevier Interactive Patient Education  2019 [employer].

## 2018-12-31 NOTE — Progress Notes (Signed)
Subjective:    Patient ID: Joseph SidleDavid Koppelman, male    DOB: 11/30/1956, 63 y.o.   MRN: [id number]  HPI He is here for a physical exam.   He denies any major changes in his health since he was here last.  He has no concerns.  His wife and him would both like to get CT cardiac testing.  They have happened in the past for more than willing to pay for it.  Overall he feels well.  Medications and allergies reviewed with patient and updated if appropriate.  Patient Active Problem List   Diagnosis Date Noted  . Avulsion fracture of lateral epicondyle of humerus 12/27/2017  . Patellofemoral arthritis of right knee 12/27/2017  . Screening for osteoporosis 11/22/2017  . Umbilical hernia 11/22/2017  . Chronic kidney disease (CKD) 06/16/2017  . History of colon polyps 06/16/2017  . History of melanoma 06/15/2017  . Hypertension 06/15/2017    Current Outpatient Medications on File Prior to Visit  Medication Sig Dispense Refill  . amLODipine (NORVASC) 2.5 MG tablet TAKE 1 TABLET(2.5 MG) BY MOUTH DAILY 90 tablet 0  . losartan-hydrochlorothiazide (HYZAAR) 100-25 MG tablet TAKE 1 TABLET BY MOUTH DAILY 90 tablet 0  . Vitamin D, Ergocalciferol, (DRISDOL) 50000 units CAPS capsule TAKE 1 CAPSULE BY MOUTH EVERY 7 DAYS 12 capsule 0  . XENICAL 120 MG capsule TAKE 1 CAPSULE THREE TIMES A DAY WITH EACH MAIN MEAL THAT CONTAINS FAT 90 capsule 2   No current facility-administered medications on file prior to visit.     Past Medical History:  Diagnosis Date  . Facial cellulitis     Past Surgical History:  Procedure Laterality Date  . MOHS SURGERY     melanoma  . TONSILLECTOMY      Social History   Socioeconomic History  . Marital status: Married    Spouse name: Diane  . Number of children: 0  . Years of education: Not on file  . Highest education level: Not on file  Occupational History  . Not on file  Social Needs  . Financial resource strain: Not on file  . Food insecurity:    Worry: Not on  file    Inability: Not on file  . Transportation needs:    Medical: Not on file    Non-medical: Not on file  Tobacco Use  . Smoking status: Never Smoker  . Smokeless tobacco: Never Used  Substance and Sexual Activity  . Alcohol use: Yes    Comment: 2 glasses / day  . Drug use: No  . Sexual activity: Not on file  Lifestyle  . Physical activity:    Days per week: Not on file    Minutes per session: Not on file  . Stress: Not on file  Relationships  . Social connections:    Talks on phone: Not on file    Gets together: Not on file    Attends religious service: Not on file    Active member of club or organization: Not on file    Attends meetings of clubs or organizations: Not on file    Relationship status: Not on file  Other Topics Concern  . Not on file  Social History Narrative  . Not on file    Family History  Problem Relation Age of Onset  . Breast cancer Mother     Review of Systems  Constitutional: Negative for chills and fever.  Eyes: Negative for visual disturbance.  Respiratory: Negative for cough, shortness of breath  and wheezing.   Cardiovascular: Negative for chest pain, palpitations and leg swelling.  Gastrointestinal: Positive for constipation (once a month). Negative for abdominal pain, blood in stool, diarrhea and nausea.       No gerd  - take pepcid ac daily - controlled  Genitourinary: Negative for difficulty urinating, dysuria and hematuria.  Musculoskeletal: Positive for arthralgias (R knee) and back pain.  Skin:       Very dry age  Neurological: Positive for light-headedness (at times). Negative for dizziness and headaches.  Psychiatric/Behavioral: Negative for dysphoric mood. The patient is not nervous/anxious.        Objective:   Vitals:   01/01/19 0823  BP: 122/72  Pulse: 70  Resp: 16  Temp: 97.8 F (36.6 C)  SpO2: 98%   Filed Weights   01/01/19 0823  Weight: 212 lb 1.9 oz (96.2 kg)   Body mass index is 28.77 kg/m.  Wt  Readings from Last 3 Encounters:  01/01/19 212 lb 1.9 oz (96.2 kg)  03/13/18 202 lb (91.6 kg)  01/24/18 208 lb (94.3 kg)     Physical Exam Constitutional: He appears well-developed and well-nourished. No distress.  HENT:  Head: Normocephalic and atraumatic.  Right Ear: External ear normal.  Left Ear: External ear normal.  Mouth/Throat: Oropharynx is clear and moist.  Normal ear canals and TM b/l  Eyes: Conjunctivae and EOM are normal.  Neck: Neck supple. No tracheal deviation present. No thyromegaly present.  No carotid bruit  Cardiovascular: Normal rate, regular rhythm, normal heart sounds and intact distal pulses.   No murmur heard. Pulmonary/Chest: Effort normal and breath sounds normal. No respiratory distress. He has no wheezes. He has no rales.  Abdominal: Soft.  Umbilical hernia-nontender, reducible.  He exhibits no distension. There is no tenderness.  Genitourinary: deferred to urology Musculoskeletal: He exhibits no edema.  Lymphadenopathy:   He has no cervical adenopathy.  Skin: Skin is warm and dry. He is not diaphoretic.  Area of left foot with dry, cracked skin on plantar surface-no active bleeding or discharge from area that is cracked, no erythema Psychiatric: He has a normal mood and affect. His behavior is normal.         Assessment & Plan:   Physical exam: Screening blood work  ordered Immunizations  Flu vaccine today, others up to date Colonoscopy   Up to date  Eye exams    Will schedule  EKG  None on file Exercise    Will start elliptical, treadmill - gets about 16109 steps a day Weight  Good BMI for age Skin   Cracked skin on feet, no other skin is-has dermatology appointment Substance abuse  none  See Problem List for Assessment and Plan of chronic medical problems.   Follow-up in 1 year

## 2019-01-01 ENCOUNTER — Ambulatory Visit (INDEPENDENT_AMBULATORY_CARE_PROVIDER_SITE_OTHER): Payer: BLUE CROSS/BLUE SHIELD | Admitting: Internal Medicine

## 2019-01-01 ENCOUNTER — Other Ambulatory Visit (INDEPENDENT_AMBULATORY_CARE_PROVIDER_SITE_OTHER): Payer: BLUE CROSS/BLUE SHIELD

## 2019-01-01 ENCOUNTER — Encounter: Payer: Self-pay | Admitting: Internal Medicine

## 2019-01-01 VITALS — BP 122/72 | HR 70 | Temp 97.8°F | Resp 16 | Ht 72.0 in | Wt 212.1 lb

## 2019-01-01 DIAGNOSIS — L853 Xerosis cutis: Secondary | ICD-10-CM

## 2019-01-01 DIAGNOSIS — I10 Essential (primary) hypertension: Secondary | ICD-10-CM

## 2019-01-01 DIAGNOSIS — Z85820 Personal history of malignant melanoma of skin: Secondary | ICD-10-CM

## 2019-01-01 DIAGNOSIS — Z0001 Encounter for general adult medical examination with abnormal findings: Secondary | ICD-10-CM | POA: Diagnosis not present

## 2019-01-01 DIAGNOSIS — Z0000 Encounter for general adult medical examination without abnormal findings: Secondary | ICD-10-CM | POA: Diagnosis not present

## 2019-01-01 DIAGNOSIS — N183 Chronic kidney disease, stage 3 unspecified: Secondary | ICD-10-CM

## 2019-01-01 DIAGNOSIS — Z23 Encounter for immunization: Secondary | ICD-10-CM | POA: Diagnosis not present

## 2019-01-01 DIAGNOSIS — K429 Umbilical hernia without obstruction or gangrene: Secondary | ICD-10-CM

## 2019-01-01 LAB — CBC WITH DIFFERENTIAL/PLATELET
Basophils Absolute: 0 10*3/uL (ref 0.0–0.1)
Basophils Relative: 0.6 % (ref 0.0–3.0)
Eosinophils Absolute: 0.4 10*3/uL (ref 0.0–0.7)
Eosinophils Relative: 5.3 % — ABNORMAL HIGH (ref 0.0–5.0)
HCT: 43.1 % (ref 39.0–52.0)
Hemoglobin: 15.5 g/dL (ref 13.0–17.0)
Lymphocytes Relative: 36.8 % (ref 12.0–46.0)
Lymphs Abs: 3 10*3/uL (ref 0.7–4.0)
MCHC: 35.9 g/dL (ref 30.0–36.0)
MCV: 97.7 fl (ref 78.0–100.0)
Monocytes Absolute: 0.8 10*3/uL (ref 0.1–1.0)
Monocytes Relative: 9.3 % (ref 3.0–12.0)
Neutro Abs: 3.9 10*3/uL (ref 1.4–7.7)
Neutrophils Relative %: 48 % (ref 43.0–77.0)
Platelets: 108 10*3/uL — ABNORMAL LOW (ref 150.0–400.0)
RBC: 4.41 Mil/uL (ref 4.22–5.81)
RDW: 14.2 % (ref 11.5–15.5)
WBC: 8.1 10*3/uL (ref 4.0–10.5)

## 2019-01-01 LAB — COMPREHENSIVE METABOLIC PANEL
ALT: 32 U/L (ref 0–53)
AST: 26 U/L (ref 0–37)
Albumin: 4.3 g/dL (ref 3.5–5.2)
Alkaline Phosphatase: 62 U/L (ref 39–117)
BUN: 22 mg/dL (ref 6–23)
CO2: 29 mEq/L (ref 19–32)
Calcium: 9.8 mg/dL (ref 8.4–10.5)
Chloride: 102 mEq/L (ref 96–112)
Creatinine, Ser: 1.59 mg/dL — ABNORMAL HIGH (ref 0.40–1.50)
GFR: 44.09 mL/min — ABNORMAL LOW (ref >60.00)
Glucose, Bld: 101 mg/dL — ABNORMAL HIGH (ref 70–99)
Potassium: 4 mEq/L (ref 3.5–5.1)
Sodium: 140 mEq/L (ref 135–145)
Total Bilirubin: 0.6 mg/dL (ref 0.2–1.2)
Total Protein: 7.4 g/dL (ref 6.0–8.3)

## 2019-01-01 LAB — LIPID PANEL
Cholesterol: 159 mg/dL (ref 0–200)
HDL: 36.7 mg/dL — ABNORMAL LOW (ref >39.00)
LDL Cholesterol: 91 mg/dL (ref 0–99)
NonHDL: 122.52
Total CHOL/HDL Ratio: 4
Triglycerides: 158 mg/dL — ABNORMAL HIGH (ref 0.0–149.0)
VLDL: 31.6 mg/dL (ref 0.0–40.0)

## 2019-01-01 LAB — TSH: TSH: 2.59 u[IU]/mL (ref 0.35–4.50)

## 2019-01-01 MED ORDER — OLMESARTAN MEDOXOMIL-HCTZ 40-25 MG PO TABS: 1.0000 | ORAL_TABLET | Freq: Every day | ORAL | Status: DC

## 2019-01-01 MED ORDER — AMMONIUM LACTATE 12 % EX LOTN: 1.0000 "application " | TOPICAL_LOTION | CUTANEOUS | 5 refills | Status: AC | PRN

## 2019-01-01 NOTE — Assessment & Plan Note (Signed)
Small hernia-nontender and reducible Monitor

## 2019-01-01 NOTE — Assessment & Plan Note (Signed)
Sees dermatology annually 

## 2019-01-01 NOTE — Assessment & Plan Note (Signed)
BP well controlled Current regimen effective and well tolerated Continue current medications at current doses cmp, CBC, TSH 

## 2019-01-01 NOTE — Assessment & Plan Note (Signed)
1 area of dry skin on the bottom of his left foot There are areas that are cracked that are very painful Rest of the foot looks good We will try AmLactin and Aquaphor He sees dermatology soon and he may need a steroid cream, but will try the above first

## 2019-01-01 NOTE — Assessment & Plan Note (Signed)
Following with Dr. Allena Katz Check CMP Continue increase fluids Avoid NSAIDs Blood pressure well controlled CMP, CBC

## 2019-01-02 LAB — PSA, TOTAL AND FREE
PSA, % Free: 60 % (calc) (ref >25)
PSA, Free: 0.6 ng/mL
PSA, Total: 1 ng/mL (ref <=4.0)

## 2019-01-03 ENCOUNTER — Other Ambulatory Visit: Payer: Self-pay | Admitting: Internal Medicine

## 2019-01-03 DIAGNOSIS — N183 Chronic kidney disease, stage 3 unspecified: Secondary | ICD-10-CM

## 2019-01-03 DIAGNOSIS — I10 Essential (primary) hypertension: Secondary | ICD-10-CM

## 2019-01-10 ENCOUNTER — Telehealth: Payer: Self-pay | Admitting: Internal Medicine

## 2019-01-10 NOTE — Telephone Encounter (Signed)
Copied from CRM [id number]. Topic: General - Other >> Jan 10, 2019  9:54 AM Tamela Oddi wrote: Reason for CRM: Patient's wife called to request the blood test results for her husband and also the patient is waiting to get an appointment for a CT scan.  Wife stated that the patient had the blood test over a week ago and still has not heard any results.  Please advise and call the patient or wife as soon as possible.  CB# [phone]

## 2019-01-10 NOTE — Telephone Encounter (Signed)
Please advise 

## 2019-01-10 NOTE — Telephone Encounter (Signed)
I am sorry I thought I had given his results.    His liver tests are normal.   His kidney function was slightly worse than last year - he looks like he was slightly dehydrated.   I think he has an upcoming appt with his kidney doctor and they will repeat this -- if not he can repeat it here in the next couple of weeks (BMP).  He needs to make sure he is drinking a lot of water during the day.   Cholesterol is good.  Thyroid function is normal.  Sugar was 101 which is just above normal.  Increasing his exercise will help keep his sugars controlled.    He has a slightly low platelet count, which are the cells that help clot blood - it is just slightly low and we just need to monitor.   His other blood counts are normal.  psa is normal   If he sees his kidney doctor soon - they will do repeat blood work --- if not let me know and I will order labs and he can stop by the lab in the next couple of weeks to recheck his kidney function.

## 2019-01-10 NOTE — Telephone Encounter (Signed)
Pts wife aware of results. I sent results in the mail per pts request.

## 2019-01-28 ENCOUNTER — Other Ambulatory Visit: Payer: Self-pay

## 2019-01-28 ENCOUNTER — Other Ambulatory Visit (INDEPENDENT_AMBULATORY_CARE_PROVIDER_SITE_OTHER): Payer: BLUE CROSS/BLUE SHIELD

## 2019-01-28 DIAGNOSIS — N183 Chronic kidney disease, stage 3 unspecified: Secondary | ICD-10-CM

## 2019-01-28 LAB — BASIC METABOLIC PANEL
BUN: 20 mg/dL (ref 6–23)
CO2: 26 mEq/L (ref 19–32)
Calcium: 9.1 mg/dL (ref 8.4–10.5)
Chloride: 107 mEq/L (ref 96–112)
Creatinine, Ser: 1.42 mg/dL (ref 0.40–1.50)
GFR: 50.22 mL/min — ABNORMAL LOW (ref >60.00)
Glucose, Bld: 90 mg/dL (ref 70–99)
Potassium: 3.9 mEq/L (ref 3.5–5.1)
Sodium: 140 mEq/L (ref 135–145)

## 2019-02-04 ENCOUNTER — Telehealth: Payer: Self-pay | Admitting: Internal Medicine

## 2019-02-04 ENCOUNTER — Ambulatory Visit (INDEPENDENT_AMBULATORY_CARE_PROVIDER_SITE_OTHER)
Admission: RE | Admit: 2019-02-04 | Discharge: 2019-02-04 | Disposition: A | Discharge status: Home / Self Care | Payer: Self-pay | Source: Ambulatory Visit | Attending: Internal Medicine | Admitting: Internal Medicine

## 2019-02-04 DIAGNOSIS — N183 Chronic kidney disease, stage 3 unspecified: Secondary | ICD-10-CM

## 2019-02-04 DIAGNOSIS — I10 Essential (primary) hypertension: Secondary | ICD-10-CM

## 2019-02-04 NOTE — Telephone Encounter (Signed)
Copied from CRM [id number]. Topic: Quick Communication - See Telephone Encounter >> Feb 04, 2019  4:13 PM Fanny Bien wrote: CRM for notification. See Telephone encounter for: 02/04/19. Pt wife called and stated that patient has an appointment on 02/11/19 with kidney doctor (dr Allena Katz) and would like most recent labs sent over. Fax#[phone]

## 2019-02-05 NOTE — Telephone Encounter (Signed)
Labs have been faxed over. Wife is aware.

## 2019-03-07 ENCOUNTER — Other Ambulatory Visit: Payer: Self-pay | Admitting: Urology

## 2019-03-23 ENCOUNTER — Other Ambulatory Visit: Payer: Self-pay | Admitting: Internal Medicine

## 2019-04-09 ENCOUNTER — Encounter (HOSPITAL_COMMUNITY): Payer: Self-pay | Admitting: *Deleted

## 2019-04-10 ENCOUNTER — Encounter (HOSPITAL_COMMUNITY): Payer: Self-pay | Admitting: *Deleted

## 2019-04-10 ENCOUNTER — Other Ambulatory Visit: Payer: Self-pay

## 2019-04-10 NOTE — Progress Notes (Signed)
SPOKE W/  _  Pt via phone     SCREENING SYMPTOMS OF COVID 19:   COUGH-- No  RUNNY NOSE---  NO  SORE THROAT--- No  NASAL CONGESTION---- No  SNEEZING---- No  SHORTNESS OF BREATH--- No  DIFFICULTY BREATHING--- No  TEMP >100.0 ----- NO  UNEXPLAINED BODY ACHES------ NO  CHILLS -------- NO  HEADACHES --------- NO  LOSS OF SMELL/ TASTE -------- NO    HAVE YOU OR ANY FAMILY MEMBER TRAVELLED PAST 14 DAYS OUT OF THE   COUNTY--- No (pt lives in [place] county and has goes only to [employer] [facility]) STATE---- NO COUNTRY---- No  HAVE YOU OR ANY FAMILY MEMBER BEEN EXPOSED TO ANYONE WITH COVID 19?   Denies.

## 2019-04-10 NOTE — Progress Notes (Signed)
Spoke w/ pt via phone for pre-op interview for lithotripsy on 04-14-2019.  Pt verbalized understanding to arrive at [facility] admitting at 0915 and be npo after mn with exception sips of water with benicar/hct and norvasc.  Pt aware to bring blue folder packet with all forms completed before arrival and no nsaids 48 hours or no asa products 72 hours prior to surgery.  Reviewed with pt to wear loose comfortable two piece clothing, no belt. Pt aware for his wife to wait for call from short stay to pick up at front entrance when discharged,  Priscille Loveless [phone].

## 2019-04-14 ENCOUNTER — Encounter (HOSPITAL_COMMUNITY)
Admission: RE | Disposition: A | Discharge status: Home / Self Care | Payer: Self-pay | Source: Home / Self Care | Attending: Urology

## 2019-04-14 ENCOUNTER — Ambulatory Visit (HOSPITAL_COMMUNITY): Payer: BLUE CROSS/BLUE SHIELD

## 2019-04-14 ENCOUNTER — Encounter (HOSPITAL_COMMUNITY): Payer: Self-pay | Admitting: *Deleted

## 2019-04-14 ENCOUNTER — Other Ambulatory Visit: Payer: Self-pay

## 2019-04-14 ENCOUNTER — Ambulatory Visit (HOSPITAL_COMMUNITY)
Admission: RE | Admit: 2019-04-14 | Discharge: 2019-04-14 | Disposition: A | Discharge status: Home / Self Care | Payer: BLUE CROSS/BLUE SHIELD | Attending: Urology | Admitting: Urology

## 2019-04-14 DIAGNOSIS — Z79899 Other long term (current) drug therapy: Secondary | ICD-10-CM | POA: Diagnosis not present

## 2019-04-14 DIAGNOSIS — Z85820 Personal history of malignant melanoma of skin: Secondary | ICD-10-CM | POA: Diagnosis not present

## 2019-04-14 DIAGNOSIS — N2581 Secondary hyperparathyroidism of renal origin: Secondary | ICD-10-CM | POA: Insufficient documentation

## 2019-04-14 DIAGNOSIS — N200 Calculus of kidney: Secondary | ICD-10-CM | POA: Diagnosis present

## 2019-04-14 DIAGNOSIS — Z8709 Personal history of other diseases of the respiratory system: Secondary | ICD-10-CM | POA: Diagnosis not present

## 2019-04-14 DIAGNOSIS — N183 Chronic kidney disease, stage 3 (moderate): Secondary | ICD-10-CM | POA: Insufficient documentation

## 2019-04-14 DIAGNOSIS — Z87442 Personal history of urinary calculi: Secondary | ICD-10-CM | POA: Diagnosis not present

## 2019-04-14 DIAGNOSIS — I129 Hypertensive chronic kidney disease with stage 1 through stage 4 chronic kidney disease, or unspecified chronic kidney disease: Secondary | ICD-10-CM | POA: Diagnosis not present

## 2019-04-14 DIAGNOSIS — I2510 Atherosclerotic heart disease of native coronary artery without angina pectoris: Secondary | ICD-10-CM | POA: Insufficient documentation

## 2019-04-14 HISTORY — DX: Umbilical hernia without obstruction or gangrene: K42.9

## 2019-04-14 HISTORY — DX: Personal history of other diseases of the respiratory system: Z87.09

## 2019-04-14 HISTORY — DX: Gastro-esophageal reflux disease without esophagitis: K21.9

## 2019-04-14 HISTORY — DX: Essential (primary) hypertension: I10

## 2019-04-14 HISTORY — DX: Male erectile dysfunction, unspecified: N52.9

## 2019-04-14 HISTORY — DX: Calculus of kidney: N20.0

## 2019-04-14 HISTORY — DX: Anemia in chronic kidney disease: D63.1

## 2019-04-14 HISTORY — DX: Personal history of urinary calculi: Z87.442

## 2019-04-14 HISTORY — DX: Chronic kidney disease, stage 3 (moderate): N18.3

## 2019-04-14 HISTORY — DX: Atherosclerotic heart disease of native coronary artery without angina pectoris: I25.10

## 2019-04-14 HISTORY — DX: Secondary hyperparathyroidism of renal origin: N25.81

## 2019-04-14 HISTORY — PX: EXTRACORPOREAL SHOCK WAVE LITHOTRIPSY: SHX1557

## 2019-04-14 HISTORY — DX: Anemia in chronic kidney disease: N18.9

## 2019-04-14 HISTORY — DX: Personal history of malignant melanoma of skin: Z85.820

## 2019-04-14 SURGERY — LITHOTRIPSY, ESWL
Anesthesia: LOCAL | Laterality: Right

## 2019-04-14 MED ORDER — DIPHENHYDRAMINE HCL 25 MG PO CAPS
25.0000 mg | ORAL_CAPSULE | ORAL | Status: AC
  Administered 2019-04-14: 10:00:00 25 mg via ORAL
  Filled 2019-04-14: qty 1

## 2019-04-14 MED ORDER — CIPROFLOXACIN HCL 500 MG PO TABS
500.0000 mg | ORAL_TABLET | ORAL | Status: AC
  Administered 2019-04-14: 500 mg via ORAL
  Filled 2019-04-14: qty 1

## 2019-04-14 MED ORDER — TAMSULOSIN HCL 0.4 MG PO CAPS: 0.4000 mg | ORAL_CAPSULE | Freq: Every day | ORAL | 0 refills | Status: DC

## 2019-04-14 MED ORDER — ONDANSETRON HCL 4 MG PO TABS: 4.0000 mg | ORAL_TABLET | Freq: Every day | ORAL | 1 refills | Status: DC | PRN

## 2019-04-14 MED ORDER — SODIUM CHLORIDE 0.9 % IV SOLN
INTRAVENOUS | Status: DC
  Administered 2019-04-14: 10:00:00 via INTRAVENOUS

## 2019-04-14 MED ORDER — HYDROCODONE-ACETAMINOPHEN 5-325 MG PO TABS: 1.0000 | ORAL_TABLET | ORAL | 0 refills | Status: DC | PRN

## 2019-04-14 MED ORDER — DIAZEPAM 5 MG PO TABS
10.0000 mg | ORAL_TABLET | ORAL | Status: AC
  Administered 2019-04-14: 10 mg via ORAL
  Filled 2019-04-14: qty 2

## 2019-04-14 NOTE — H&P (Signed)
Urology Preoperative H&P   Chief Complaint: Right renal stone  History of Present Illness: Joseph Oconnell is a 63 y.o. male with a history of kidney stones.  He was found to have a 9 mm right renal stone on recent KUB.  He denies renal colic, recent stone passage, dysuria or hematuria.  He is here today for ESWL of his right renal stone.     Past Medical History:  Diagnosis Date  . Anemia due to chronic kidney disease   . CKD (chronic kidney disease), stage III [facility])    nephrologist-  dr Vonna Kotyk patel  . Coronary artery calcification seen on CT scan 02/04/2019  . ED (erectile dysfunction)   . GERD (gastroesophageal reflux disease)   . History of asthma    child  . History of kidney stones   . History of malignant melanoma of skin    s/p  moh's excision of forehead  . Hypertension   . Renal calculus, right   . Renal calculus, right   . Secondary hyperparathyroidism of renal origin (HCC)   . Umbilical hernia     Past Surgical History:  Procedure Laterality Date  . COLONOSCOPY  last one 11-14-2016  . EXTRACORPOREAL SHOCK WAVE LITHOTRIPSY  2010  . MOHS SURGERY  2005 approx.   melanoma of forehead  . TONSILLECTOMY  child    Allergies: No Known Allergies  Family History  Problem Relation Age of Onset  . Breast cancer Mother     Social History:  reports that he has never smoked. He has never used smokeless tobacco. He reports current alcohol use. He reports that he does not use drugs.  ROS: A complete review of systems was performed.  All systems are negative except for pertinent findings as noted.  Physical Exam:  Vital signs in last 24 hours:   Constitutional:  Alert and oriented, No acute distress Cardiovascular: Regular rate and rhythm, No JVD Respiratory: Normal respiratory effort, Lungs clear bilaterally GI: Abdomen is soft, nontender, nondistended, no abdominal masses GU: No CVA tenderness Lymphatic: No lymphadenopathy Neurologic: Grossly intact, no focal  deficits Psychiatric: Normal mood and affect  Laboratory Data:  No results for input(s): WBC, HGB, HCT, PLT in the last 72 hours.  No results for input(s): NA, K, CL, GLUCOSE, BUN, CALCIUM, CREATININE in the last 72 hours.  Invalid input(s): CO3   No results found for this or any previous visit (from the past 24 hour(s)). No results found for this or any previous visit (from the past 240 hour(s)).  Renal Function: No results for input(s): CREATININE in the last 168 hours. CrCl cannot be calculated (Patient's most recent lab result is older than the maximum 21 days allowed.).  Radiologic Imaging: No results found.  I independently reviewed the above imaging studies.  Assessment and Plan Joseph Oconnell is a 63 y.o. male with a 9 mm right renal stone  The risks, benefits and alternatives of RIGHT ESWL was discussed with the patient. I described the risks which include arrhythmia, kidney contusion, kidney hemorrhage, need for transfusion, back discomfort, flank ecchymosis, flank abrasion, inability to fracture the stone, inability to pass stone fragments, Steinstrasse, infection associated with obstructing stones, need for an alternative surgical procedure and possible need for repeat shockwave lithotripsy.  The patient voices understanding and wishes to proceed.      Rhoderick Moody, MD 04/14/2019, 6:35 AM  [facility] Pager: [phone]

## 2019-04-14 NOTE — Op Note (Signed)
[facility] Operative Note  Treating Physician: Christopher Winter, MD  Pre-op diagnosis: 9 mm right renal stone  Post-op diagnosis: Same   Procedure: RIGHT ESWL  See [facility] OP note scanned into chart. Also because of the size, density, location and other factors that cannot be anticipated I feel this will likely be a staged procedure. This fact supersedes any indication in the scanned [facility] operative note to the contrary    

## 2019-04-15 ENCOUNTER — Encounter (HOSPITAL_COMMUNITY): Payer: Self-pay | Admitting: Urology

## 2019-08-08 ENCOUNTER — Telehealth: Payer: Self-pay

## 2019-08-08 DIAGNOSIS — N183 Chronic kidney disease, stage 3 unspecified: Secondary | ICD-10-CM

## 2019-08-08 NOTE — Telephone Encounter (Signed)
Copied from [place] [id number]. Topic: General - Other >> Aug 06, 2019  2:54 PM Celene Kras A wrote: Reason for CRM: Pts wife called stating she would like to know if pts records were sent over from kidney doctor as well as to let PCP know that his kidney function had dropped 10 points. Pts wife is requesting a call back to discuss this. Pts wife states it was in march that the records should have been sent over. Please advise.

## 2019-08-08 NOTE — Telephone Encounter (Signed)
I spoke with wife and advised that we did receive these records from march. She wanted you to take a look at them and look at the blood work. She was worried about the kidney function and wanted your opinion if any labs should be redone.

## 2019-08-08 NOTE — Addendum Note (Signed)
Addended by: Binnie Rail on: 08/08/2019 12:12 PM   Modules accepted: Orders

## 2019-08-08 NOTE — Telephone Encounter (Signed)
We can recheck it.  His kidney function has varied over the past year or so -- it was similar to what what he had at the kidney doctor in January here, but was better in February.    I ordered blood work to recheck his kidney function if he wants to come in for it.  He does not need to fast and ideally should not fast so that he is not dehydrated.

## 2019-08-11 NOTE — Telephone Encounter (Signed)
Pt aware.

## 2019-08-12 NOTE — Telephone Encounter (Signed)
Patient's wife requesting a call back from PCP or CMA to discuss results further. She specifically would like to know if Dr. Quay Burow with concerned about results and if they need to do additional testing before the end of the year.

## 2019-08-13 NOTE — Telephone Encounter (Signed)
Pts wife aware of response below. She did not know I had called the first time. Wife understood and will have labs done next week.

## 2019-08-13 NOTE — Telephone Encounter (Signed)
The energy drinks are not ideal-they have been shown to increase the risk of heart disease and or not great for the kidneys.  I would replace this with plain water.

## 2019-08-13 NOTE — Telephone Encounter (Signed)
Wife also states husband drinks 1 energy drink a day and would like to know what you think about that with his kidney function being the way it is.

## 2019-08-14 NOTE — Telephone Encounter (Signed)
Spoke with patient and info given 

## 2019-08-21 ENCOUNTER — Ambulatory Visit (INDEPENDENT_AMBULATORY_CARE_PROVIDER_SITE_OTHER): Payer: BC Managed Care – PPO

## 2019-08-21 ENCOUNTER — Other Ambulatory Visit (INDEPENDENT_AMBULATORY_CARE_PROVIDER_SITE_OTHER): Payer: BC Managed Care – PPO

## 2019-08-21 ENCOUNTER — Other Ambulatory Visit: Payer: Self-pay

## 2019-08-21 DIAGNOSIS — N183 Chronic kidney disease, stage 3 unspecified: Secondary | ICD-10-CM

## 2019-08-21 DIAGNOSIS — Z23 Encounter for immunization: Secondary | ICD-10-CM

## 2019-08-21 LAB — BASIC METABOLIC PANEL
BUN: 23 mg/dL (ref 6–23)
CO2: 28 mEq/L (ref 19–32)
Calcium: 9.7 mg/dL (ref 8.4–10.5)
Chloride: 102 mEq/L (ref 96–112)
Creatinine, Ser: 1.37 mg/dL (ref 0.40–1.50)
GFR: 52.25 mL/min — ABNORMAL LOW (ref >60.00)
Glucose, Bld: 86 mg/dL (ref 70–99)
Potassium: 3.7 mEq/L (ref 3.5–5.1)
Sodium: 139 mEq/L (ref 135–145)

## 2019-09-16 ENCOUNTER — Other Ambulatory Visit: Payer: Self-pay | Admitting: Internal Medicine

## 2020-01-02 ENCOUNTER — Ambulatory Visit: Payer: Managed Care, Other (non HMO) | Attending: Internal Medicine

## 2020-01-02 DIAGNOSIS — Z23 Encounter for immunization: Secondary | ICD-10-CM | POA: Insufficient documentation

## 2020-01-02 NOTE — Progress Notes (Signed)
   Covid-19 Vaccination Clinic  Name:  Joseph Oconnell    MRN: [id number] DOB: 01-21-1956  01/02/2020  [name] was observed post Covid-19 immunization for 15 minutes without incidence. He was provided with Vaccine Information Sheet and instruction to access the V-Safe system.   [name] was instructed to call 911 with any severe reactions post vaccine: Marland Kitchen Difficulty breathing  . Swelling of your face and throat  . A fast heartbeat  . A bad rash all over your body  . Dizziness and weakness    Immunizations Administered    Name Date Dose VIS Date Route   Pfizer COVID-19 Vaccine 01/02/2020  3:12 PM 0.3 mL 11/21/2019 Intramuscular   Manufacturer: [employer], [employer]   Lot: [id number]   NDC: [address]-2

## 2020-01-20 ENCOUNTER — Telehealth: Payer: Self-pay | Admitting: Internal Medicine

## 2020-01-20 DIAGNOSIS — N1831 Chronic kidney disease, stage 3a: Secondary | ICD-10-CM

## 2020-01-20 DIAGNOSIS — Z125 Encounter for screening for malignant neoplasm of prostate: Secondary | ICD-10-CM

## 2020-01-20 DIAGNOSIS — Z0000 Encounter for general adult medical examination without abnormal findings: Secondary | ICD-10-CM

## 2020-01-20 DIAGNOSIS — Z13820 Encounter for screening for osteoporosis: Secondary | ICD-10-CM

## 2020-01-20 DIAGNOSIS — I10 Essential (primary) hypertension: Secondary | ICD-10-CM

## 2020-01-20 DIAGNOSIS — R739 Hyperglycemia, unspecified: Secondary | ICD-10-CM

## 2020-01-20 NOTE — Telephone Encounter (Signed)
Gaege had a bone density in 2019 which was normal.  Insurance will not pay for this. I can order it if he is willing to pay for it.  Let me know.

## 2020-01-20 NOTE — Telephone Encounter (Signed)
   Request order for bone density  

## 2020-01-21 NOTE — Addendum Note (Signed)
Addended by: Pincus Sanes on: 01/21/2020 12:55 PM   Modules accepted: Orders

## 2020-01-21 NOTE — Telephone Encounter (Signed)
dexa ordered.  Labs ordered

## 2020-01-21 NOTE — Telephone Encounter (Signed)
Pts wife aware. Would like to have CPE labs put in before appointment. Lab appointment scheduled.

## 2020-01-22 ENCOUNTER — Ambulatory Visit: Payer: Managed Care, Other (non HMO) | Attending: Internal Medicine

## 2020-01-22 DIAGNOSIS — Z23 Encounter for immunization: Secondary | ICD-10-CM | POA: Insufficient documentation

## 2020-01-22 NOTE — Progress Notes (Signed)
   Covid-19 Vaccination [facility]  Name:  Maijor Hornig    MRN: [id number] DOB: August 15, 1956  01/22/2020  Mr. Tench was observed post Covid-19 immunization for 15 minutes without incidence. He was provided with Vaccine Information Sheet and instruction to access the V-Safe system.   Mr. Bonura was instructed to call 911 with any severe reactions post vaccine: Marland Kitchen Difficulty breathing  . Swelling of your face and throat  . A fast heartbeat  . A bad rash all over your body  . Dizziness and weakness    Immunizations Administered    Name Date Dose VIS Date Route   Pfizer COVID-19 Vaccine 01/22/2020 11:05 AM 0.3 mL 11/21/2019 Intramuscular   Manufacturer: [employer], [employer]   Lot: [id number]   NDC: [address]-3

## 2020-01-30 DIAGNOSIS — N200 Calculus of kidney: Secondary | ICD-10-CM | POA: Insufficient documentation

## 2020-01-30 NOTE — Progress Notes (Signed)
Subjective:    Patient ID: Joseph Oconnell, male    DOB: October 28, 1956, 64 y.o.   MRN: [id number]  HPI He is here for a physical exam.   He has intermittent lower back pain.  He tripped and fell at [facility] and has lower back pain more on the left side. He has pain radiating to the hip.  No N/t and weakness in the legs.   He is interested in seeing orthopedics.    He does not have any concerns regarding his memory, but he did take pravastatin for a while but did not tolerate it.  After stopping it he felt better.  He tells me his wife is concerned about his memory.  He again feels that is fine.   Medications and allergies reviewed with patient and updated if appropriate.  Patient Active Problem List   Diagnosis Date Noted  . Kidney stone 01/30/2020  . Dry skin dermatitis 01/01/2019  . Avulsion fracture of lateral epicondyle of humerus 12/27/2017  . Patellofemoral arthritis of right knee 12/27/2017  . Screening for osteoporosis 11/22/2017  . Umbilical hernia 11/22/2017  . Chronic kidney disease (CKD) 06/16/2017  . History of colon polyps 06/16/2017  . History of melanoma 06/15/2017  . Hypertension 06/15/2017    Current Outpatient Medications on File Prior to Visit  Medication Sig Dispense Refill  . amLODipine (NORVASC) 2.5 MG tablet TAKE 1 TABLET(2.5 MG) BY MOUTH DAILY 90 tablet 1  . ammonium lactate (AMLACTIN) 12 % lotion Apply 1 application topically as needed for dry skin. (Patient taking differently: Apply 1 application topically as needed for dry skin (left foot). ) 400 g 5  . BIOTIN MAXIMUM PO Take by mouth.    . ELDERBERRY PO Take by mouth.    . Folic Acid (FOLATE PO) Take 665 mcg by mouth.    . IODINE EX Apply topically.    . Multiple Vitamins-Minerals (MULTIVITAMIN MEN 50+) TABS Take 1 tablet by mouth daily.    Marland Kitchen olmesartan-hydrochlorothiazide (BENICAR HCT) 40-25 MG tablet Take 1 tablet by mouth daily. (Patient taking differently: Take 1 tablet by mouth daily. )    .  Pyridoxine HCl (B-6 PO) Take by mouth.    . Selenium 100 MCG TABS Take by mouth.    . Vitamin A 2400 MCG (8000 UT) CAPS Take 1,200 mcg by mouth.    . vitamin E (VITAMIN E) 180 MG (400 UNITS) capsule Take 400 Units by mouth daily.    . Zinc Sulfate 110 MG TABS Take by mouth.     No current facility-administered medications on file prior to visit.    Past Medical History:  Diagnosis Date  . Anemia due to chronic kidney disease   . CKD (chronic kidney disease), stage III    nephrologist-  dr Vonna Kotyk patel  . Coronary artery calcification seen on CT scan 02/04/2019  . ED (erectile dysfunction)   . GERD (gastroesophageal reflux disease)   . History of asthma    child  . History of kidney stones   . History of malignant melanoma of skin    s/p  moh's excision of forehead  . Hypertension   . Renal calculus, right   . Renal calculus, right   . Secondary hyperparathyroidism of renal origin (HCC)   . Umbilical hernia     Past Surgical History:  Procedure Laterality Date  . COLONOSCOPY  last one 11-14-2016  . EXTRACORPOREAL SHOCK WAVE LITHOTRIPSY  2010  . EXTRACORPOREAL SHOCK WAVE LITHOTRIPSY Right 04/14/2019  Procedure: EXTRACORPOREAL SHOCK WAVE LITHOTRIPSY (ESWL);  Surgeon: Rene Paci, MD;  Location: [facility];  Service: Urology;  Laterality: Right;  . MOHS SURGERY  2005 approx.   melanoma of forehead  . TONSILLECTOMY  child    Social History   Socioeconomic History  . Marital status: Married    Spouse name: Diane  . Number of children: 0  . Years of education: Not on file  . Highest education level: Not on file  Occupational History  . Not on file  Tobacco Use  . Smoking status: Never Smoker  . Smokeless tobacco: Never Used  Substance and Sexual Activity  . Alcohol use: Yes    Comment: 1-2 drinks per day  . Drug use: Never  . Sexual activity: Not on file  Other Topics Concern  . Not on file  Social History Narrative  . Not on file   Social Determinants of  Health   Financial Resource Strain:   . Difficulty of Paying Living Expenses: Not on file  Food Insecurity:   . Worried About Programme researcher, broadcasting/film/video in the Last Year: Not on file  . Ran Out of Food in the Last Year: Not on file  Transportation Needs:   . Lack of Transportation (Medical): Not on file  . Lack of Transportation (Non-Medical): Not on file  Physical Activity:   . Days of Exercise per Week: Not on file  . Minutes of Exercise per Session: Not on file  Stress:   . Feeling of Stress : Not on file  Social Connections:   . Frequency of Communication with Friends and Family: Not on file  . Frequency of Social Gatherings with Friends and Family: Not on file  . Attends Religious Services: Not on file  . Active Member of Clubs or Organizations: Not on file  . Attends Banker Meetings: Not on file  . Marital Status: Not on file    Family History  Problem Relation Age of Onset  . Breast cancer Mother     Review of Systems  Constitutional: Negative for chills and fever.  Eyes: Negative for visual disturbance.  Respiratory: Negative for cough, shortness of breath and wheezing.   Cardiovascular: Negative for chest pain, palpitations and leg swelling.  Gastrointestinal: Negative for abdominal pain, blood in stool, constipation, diarrhea and nausea.       Occ GERD - avoiding foods that cause it - pepcid AC 1-2 times a week  Genitourinary: Negative for dysuria and hematuria.  Musculoskeletal: Positive for arthralgias (r knee) and back pain.  Skin: Negative for color change and rash.  Neurological: Negative for light-headedness and headaches.  Psychiatric/Behavioral: Negative for dysphoric mood. The patient is not nervous/anxious.        Objective:   Vitals:   02/02/20 0952  BP: 128/70  Pulse: 65  Resp: 16  Temp: 98.5 F (36.9 C)  SpO2: 96%   Filed Weights   02/02/20 0952  Weight: 206 lb 12.8 oz (93.8 kg)   Body mass index is 28.05 kg/m.  BP Readings  from Last 3 Encounters:  02/02/20 128/70  04/14/19 106/80  01/01/19 122/72    Wt Readings from Last 3 Encounters:  02/02/20 206 lb 12.8 oz (93.8 kg)  04/14/19 199 lb 15.3 oz (90.7 kg)  01/01/19 212 lb 1.9 oz (96.2 kg)     Physical Exam Constitutional: He appears well-developed and well-nourished. No distress.  HENT:  Head: Normocephalic and atraumatic.  Right Ear: External ear normal.  Left  Ear: External ear normal.  Mouth/Throat: Oropharynx is clear and moist.  Normal ear canals and TM b/l  Eyes: Conjunctivae and EOM are normal.  Neck: Neck supple. No tracheal deviation present. No thyromegaly present.  No carotid bruit  Cardiovascular: Normal rate, regular rhythm, normal heart sounds and intact distal pulses.   No murmur heard. Pulmonary/Chest: Effort normal and breath sounds normal. No respiratory distress. He has no wheezes. He has no rales.  Abdominal: Soft. He exhibits no distension. There is no tenderness.  Genitourinary: deferred  Musculoskeletal: He exhibits no edema.  Lymphadenopathy:   He has no cervical adenopathy.  Skin: Skin is warm and dry. He is not diaphoretic.  Psychiatric: He has a normal mood and affect. His behavior is normal.         Assessment & Plan:   Physical exam: Screening blood work  ordered Immunizations  Up to date  Colonoscopy   Up to date  Eye exams   Up to date  Exercise   Elliptical once a week-stressed doing the elliptical 3-4 times a week Weight  Good for age Substance abuse   none   See Problem List for Assessment and Plan of chronic medical problems.   This visit occurred during the SARS-CoV-2 public health emergency.  Safety protocols were in place, including screening questions prior to the visit, additional usage of staff PPE, and extensive cleaning of exam room while observing appropriate contact time as indicated for disinfecting solutions.

## 2020-01-30 NOTE — Patient Instructions (Addendum)
Blood work was ordered.    All other Health Maintenance issues reviewed.   All recommended immunizations and age-appropriate screenings are up-to-date or discussed.  No immunization administered today.   Medications reviewed and updated.  Changes include :   Start taking vitamin D 3 1000 units a day.  Your prescription(s) have been submitted to your pharmacy. Please take as directed and contact our office if you believe you are having problem(s) with the medication(s).  A referral was ordered for orthopedics for your back pain and neuropsychology for evaluation of your memory  Please followup in 1 year      Health Maintenance, Male Adopting a healthy lifestyle and getting preventive care are important in promoting health and wellness. Ask your health care provider about:  The right schedule for you to have regular tests and exams.  Things you can do on your own to prevent diseases and keep yourself healthy. What should I know about diet, weight, and exercise? Eat a healthy diet   Eat a diet that includes plenty of vegetables, fruits, low-fat dairy products, and lean protein.  Do not eat a lot of foods that are high in solid fats, added sugars, or sodium. Maintain a healthy weight Body mass index (BMI) is a measurement that can be used to identify possible weight problems. It estimates body fat based on height and weight. Your health care provider can help determine your BMI and help you achieve or maintain a healthy weight. Get regular exercise Get regular exercise. This is one of the most important things you can do for your health. Most adults should:  Exercise for at least 150 minutes each week. The exercise should increase your heart rate and make you sweat (moderate-intensity exercise).  Do strengthening exercises at least twice a week. This is in addition to the moderate-intensity exercise.  Spend less time sitting. Even light physical activity can be  beneficial. Watch cholesterol and blood lipids Have your blood tested for lipids and cholesterol at 65 years of age, then have this test every 5 years. You may need to have your cholesterol levels checked more often if:  Your lipid or cholesterol levels are high.  You are older than 65 years of age.  You are at high risk for heart disease. What should I know about cancer screening? Many types of cancers can be detected early and may often be prevented. Depending on your health history and family history, you may need to have cancer screening at various ages. This may include screening for:  Colorectal cancer.  Prostate cancer.  Skin cancer.  Lung cancer. What should I know about heart disease, diabetes, and high blood pressure? Blood pressure and heart disease  High blood pressure causes heart disease and increases the risk of stroke. This is more likely to develop in people who have high blood pressure readings, are of African descent, or are overweight.  Talk with your health care provider about your target blood pressure readings.  Have your blood pressure checked: ? Every 3-5 years if you are 78-43 years of age. ? Every year if you are 85 years old or older.  If you are between the ages of 17 and 71 and are a current or former smoker, ask your health care provider if you should have a one-time screening for abdominal aortic aneurysm (AAA). Diabetes Have regular diabetes screenings. This checks your fasting blood sugar level. Have the screening done:  Once every three years after age 47 if you are  at a normal weight and have a low risk for diabetes.  More often and at a younger age if you are overweight or have a high risk for diabetes. What should I know about preventing infection? Hepatitis B If you have a higher risk for hepatitis B, you should be screened for this virus. Talk with your health care provider to find out if you are at risk for hepatitis B  infection. Hepatitis C Blood testing is recommended for:  Everyone born from 1 through 1966.  Anyone with known risk factors for hepatitis C. Sexually transmitted infections (STIs)  You should be screened each year for STIs, including gonorrhea and chlamydia, if: ? You are sexually active and are younger than 65 years of age. ? You are older than 65 years of age and your health care provider tells you that you are at risk for this type of infection. ? Your sexual activity has changed since you were last screened, and you are at increased risk for chlamydia or gonorrhea. Ask your health care provider if you are at risk.  Ask your health care provider about whether you are at high risk for HIV. Your health care provider may recommend a prescription medicine to help prevent HIV infection. If you choose to take medicine to prevent HIV, you should first get tested for HIV. You should then be tested every 3 months for as long as you are taking the medicine. Follow these instructions at home: Lifestyle  Do not use any products that contain nicotine or tobacco, such as cigarettes, e-cigarettes, and chewing tobacco. If you need help quitting, ask your health care provider.  Do not use street drugs.  Do not share needles.  Ask your health care provider for help if you need support or information about quitting drugs. Alcohol use  Do not drink alcohol if your health care provider tells you not to drink.  If you drink alcohol: ? Limit how much you have to 0-2 drinks a day. ? Be aware of how much alcohol is in your drink. In the [place] one drink equals one 12 oz bottle of beer (355 mL), one 5 oz glass of wine (148 mL), or one 1 oz glass of hard liquor (44 mL). General instructions  Schedule regular health, dental, and eye exams.  Stay current with your vaccines.  Tell your health care provider if: ? You often feel depressed. ? You have ever been abused or do not feel safe at  home. Summary  Adopting a healthy lifestyle and getting preventive care are important in promoting health and wellness.  Follow your health care provider's instructions about healthy diet, exercising, and getting tested or screened for diseases.  Follow your health care provider's instructions on monitoring your cholesterol and blood pressure. This information is not intended to replace advice given to you by your health care provider. Make sure you discuss any questions you have with your health care provider. Document Revised: 11/20/2018 Document Reviewed: 11/20/2018 Elsevier Patient Education  2020 [employer].

## 2020-02-02 ENCOUNTER — Encounter: Payer: Self-pay | Admitting: Internal Medicine

## 2020-02-02 ENCOUNTER — Other Ambulatory Visit (INDEPENDENT_AMBULATORY_CARE_PROVIDER_SITE_OTHER): Payer: Managed Care, Other (non HMO)

## 2020-02-02 ENCOUNTER — Ambulatory Visit (INDEPENDENT_AMBULATORY_CARE_PROVIDER_SITE_OTHER): Payer: Managed Care, Other (non HMO) | Admitting: Internal Medicine

## 2020-02-02 ENCOUNTER — Other Ambulatory Visit: Payer: Self-pay

## 2020-02-02 VITALS — BP 128/70 | HR 65 | Temp 98.5°F | Resp 16 | Ht 72.0 in | Wt 206.8 lb

## 2020-02-02 DIAGNOSIS — N1831 Chronic kidney disease, stage 3a: Secondary | ICD-10-CM

## 2020-02-02 DIAGNOSIS — Z125 Encounter for screening for malignant neoplasm of prostate: Secondary | ICD-10-CM | POA: Diagnosis not present

## 2020-02-02 DIAGNOSIS — M545 Low back pain, unspecified: Secondary | ICD-10-CM | POA: Insufficient documentation

## 2020-02-02 DIAGNOSIS — Z0000 Encounter for general adult medical examination without abnormal findings: Secondary | ICD-10-CM | POA: Diagnosis not present

## 2020-02-02 DIAGNOSIS — R4189 Other symptoms and signs involving cognitive functions and awareness: Secondary | ICD-10-CM

## 2020-02-02 DIAGNOSIS — Z85820 Personal history of malignant melanoma of skin: Secondary | ICD-10-CM | POA: Diagnosis not present

## 2020-02-02 DIAGNOSIS — I10 Essential (primary) hypertension: Secondary | ICD-10-CM

## 2020-02-02 DIAGNOSIS — R739 Hyperglycemia, unspecified: Secondary | ICD-10-CM

## 2020-02-02 HISTORY — DX: Low back pain: M54.5

## 2020-02-02 HISTORY — DX: Low back pain, unspecified: M54.50

## 2020-02-02 LAB — PSA, MEDICARE: PSA: 1.13 ng/ml (ref 0.10–4.00)

## 2020-02-02 LAB — CBC WITH DIFFERENTIAL/PLATELET
Basophils Absolute: 0 10*3/uL (ref 0.0–0.1)
Basophils Relative: 0.5 % (ref 0.0–3.0)
Eosinophils Absolute: 0.3 10*3/uL (ref 0.0–0.7)
Eosinophils Relative: 3.1 % (ref 0.0–5.0)
HCT: 43.1 % (ref 39.0–52.0)
Hemoglobin: 15.4 g/dL (ref 13.0–17.0)
Lymphocytes Relative: 40 % (ref 12.0–46.0)
Lymphs Abs: 3.7 10*3/uL (ref 0.7–4.0)
MCHC: 35.7 g/dL (ref 30.0–36.0)
MCV: 95.1 fl (ref 78.0–100.0)
Monocytes Absolute: 0.8 10*3/uL (ref 0.1–1.0)
Monocytes Relative: 8.9 % (ref 3.0–12.0)
Neutro Abs: 4.4 10*3/uL (ref 1.4–7.7)
Neutrophils Relative %: 47.5 % (ref 43.0–77.0)
Platelets: 108 10*3/uL — ABNORMAL LOW (ref 150.0–400.0)
RBC: 4.53 Mil/uL (ref 4.22–5.81)
RDW: 13.6 % (ref 11.5–15.5)
WBC: 9.2 10*3/uL (ref 4.0–10.5)

## 2020-02-02 LAB — COMPREHENSIVE METABOLIC PANEL
ALT: 23 U/L (ref 0–53)
AST: 21 U/L (ref 0–37)
Albumin: 4.6 g/dL (ref 3.5–5.2)
Alkaline Phosphatase: 71 U/L (ref 39–117)
BUN: 22 mg/dL (ref 6–23)
CO2: 26 mEq/L (ref 19–32)
Calcium: 9.9 mg/dL (ref 8.4–10.5)
Chloride: 103 mEq/L (ref 96–112)
Creatinine, Ser: 1.41 mg/dL (ref 0.40–1.50)
GFR: 50.47 mL/min — ABNORMAL LOW (ref >60.00)
Glucose, Bld: 100 mg/dL — ABNORMAL HIGH (ref 70–99)
Potassium: 3.8 mEq/L (ref 3.5–5.1)
Sodium: 138 mEq/L (ref 135–145)
Total Bilirubin: 1 mg/dL (ref 0.2–1.2)
Total Protein: 7.3 g/dL (ref 6.0–8.3)

## 2020-02-02 LAB — LIPID PANEL
Cholesterol: 174 mg/dL (ref 0–200)
HDL: 39 mg/dL — ABNORMAL LOW (ref >39.00)
LDL Cholesterol: 104 mg/dL — ABNORMAL HIGH (ref 0–99)
NonHDL: 135.1
Total CHOL/HDL Ratio: 4
Triglycerides: 158 mg/dL — ABNORMAL HIGH (ref 0.0–149.0)
VLDL: 31.6 mg/dL (ref 0.0–40.0)

## 2020-02-02 LAB — TSH: TSH: 3.86 u[IU]/mL (ref 0.35–4.50)

## 2020-02-02 LAB — HEMOGLOBIN A1C: Hgb A1c MFr Bld: 5.2 % (ref 4.6–6.5)

## 2020-02-02 NOTE — Assessment & Plan Note (Signed)
Chronic BP well controlled Current regimen effective and well tolerated Continue current medications at current doses cmp  

## 2020-02-02 NOTE — Assessment & Plan Note (Addendum)
Chronic Following with Dr. Allena Katz Drinks a lot of tea, energy drink once a day-advised to keep these to a minimum and drink as much water as possible cmp Does not take any nsaids, except on a rare occasion and will only take a couple of doses

## 2020-02-02 NOTE — Assessment & Plan Note (Addendum)
Up to date with dermatology.  

## 2020-02-02 NOTE — Assessment & Plan Note (Signed)
Has had intermittent lower back pain for years.  Most recent episode started around Christmas time and has not resolved He is interested in seeing orthopedics Referred today

## 2020-02-02 NOTE — Assessment & Plan Note (Signed)
He does not feel he necessarily has any issues with his memory and has been tested in the past.  His wife is very concerned about his memory and feels there is some memory issue He agrees to get tested-refer to neuropsychology today

## 2020-02-03 ENCOUNTER — Encounter: Payer: Self-pay | Admitting: Internal Medicine

## 2020-02-09 ENCOUNTER — Telehealth: Payer: Self-pay | Admitting: Internal Medicine

## 2020-02-09 DIAGNOSIS — I2510 Atherosclerotic heart disease of native coronary artery without angina pectoris: Secondary | ICD-10-CM

## 2020-02-09 NOTE — Telephone Encounter (Signed)
New message:   Pt wife is calling and states since the pt's last visit he has decided to start taking the low-cholosterol medication that was discussed. She also states a referral was supposed to be sent for neurophysiology and no one has called. Please advise.

## 2020-02-10 ENCOUNTER — Encounter: Payer: Self-pay | Admitting: Psychology

## 2020-02-10 DIAGNOSIS — I2510 Atherosclerotic heart disease of native coronary artery without angina pectoris: Secondary | ICD-10-CM | POA: Insufficient documentation

## 2020-02-10 HISTORY — DX: Atherosclerotic heart disease of native coronary artery without angina pectoris: I25.10

## 2020-02-10 MED ORDER — ROSUVASTATIN CALCIUM 10 MG PO TABS: 10.0000 mg | ORAL_TABLET | Freq: Every day | ORAL | 3 refills | Status: DC

## 2020-02-10 NOTE — Telephone Encounter (Signed)
Please advise on low dose cholesterol meds

## 2020-02-10 NOTE — Telephone Encounter (Signed)
Wife is aware

## 2020-02-10 NOTE — Telephone Encounter (Signed)
Start crestor 10 mg daily.  Recheck lipids, liver tests in 6 months -- all pending.

## 2020-02-10 NOTE — Telephone Encounter (Signed)
They actually already left vm 2x to call them back - I will call pt and give them their number

## 2020-02-10 NOTE — Telephone Encounter (Signed)
How long do these referrals usually take? I know it was just put in and it can take some time to hear something.

## 2020-02-11 ENCOUNTER — Other Ambulatory Visit: Payer: Managed Care, Other (non HMO)

## 2020-02-18 ENCOUNTER — Other Ambulatory Visit: Payer: Self-pay

## 2020-02-18 ENCOUNTER — Encounter: Payer: Self-pay | Admitting: Family Medicine

## 2020-02-18 ENCOUNTER — Ambulatory Visit: Payer: Self-pay

## 2020-02-18 ENCOUNTER — Ambulatory Visit (INDEPENDENT_AMBULATORY_CARE_PROVIDER_SITE_OTHER): Payer: Managed Care, Other (non HMO) | Admitting: Family Medicine

## 2020-02-18 DIAGNOSIS — M545 Low back pain, unspecified: Secondary | ICD-10-CM

## 2020-02-18 NOTE — Progress Notes (Signed)
    SUBJECTIVE:   CHIEF COMPLAINT / HPI:  20 years ago in [facility] and reports not being able to walk for about a week. Flares up once a year usually. But, two months ago, slipped in the dark and fell. Pt doesn't remember mechanism of fall, but remembered landing on his back. Couldn't walk for two days after. Now, can't ride his bike, but likes to stay active. Usually just a constant dull pain and worse with certain movements. Difficult to get in and out of his truck and with bending forward, or running. Elliptical is okay. Denies saddle anaesthesia, bladder and bowel incontinence. Does not want to do any pain killers or medications.   PERTINENT  PMH / PSH: No back surgeries   OBJECTIVE:   There were no vitals taken for this visit.  General: well appearing male, NAD  Lower Back: no gross abnormalities. TTP to L3-L4 spinous process, worse to palpation of left paraspinal muscle. Limited torso rotation. Hip flexion and extension limited by pain. 5/5 strength hip flexion, though pain elicited with resistance, especially to left side.   ASSESSMENT/PLAN:   1. Lower Back Pain  Muscle strain vs herniated disc. Will start with conservative measures with referral to [facility] chiropractor and physical therapy. If no improvement, obtain MRI and return to care.    Melene Plan, MD [facility]
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
## 2020-03-08 ENCOUNTER — Telehealth: Payer: Self-pay

## 2020-03-08 NOTE — Telephone Encounter (Signed)
Recent labs faxed over to [facility] urology

## 2020-03-08 NOTE — Telephone Encounter (Signed)
New message   The wife calling needs labs work fax over to [facility] appt @ 11:00 am with Dr. Earvin Hansen today.   Fax # [phone]

## 2020-03-14 ENCOUNTER — Other Ambulatory Visit: Payer: Self-pay | Admitting: Internal Medicine

## 2020-04-19 ENCOUNTER — Other Ambulatory Visit: Payer: Self-pay

## 2020-04-19 ENCOUNTER — Encounter: Payer: Self-pay | Admitting: Psychology

## 2020-04-19 ENCOUNTER — Ambulatory Visit (INDEPENDENT_AMBULATORY_CARE_PROVIDER_SITE_OTHER): Payer: Medicare Other | Admitting: Psychology

## 2020-04-19 ENCOUNTER — Ambulatory Visit: Payer: Medicare Other | Admitting: Psychology

## 2020-04-19 DIAGNOSIS — R4189 Other symptoms and signs involving cognitive functions and awareness: Secondary | ICD-10-CM

## 2020-04-19 NOTE — Progress Notes (Signed)
NEUROPSYCHOLOGICAL EVALUATION [place]. [facility] Department of Neurology  Date of Evaluation: Apr 19, 2020  Reason for Referral:   Joseph Oconnell is a 63 y.o. right-handed Caucasian male referred by Cheryll Cockayne, M.D., to characterize his current cognitive functioning and assist with diagnostic clarity and treatment planning in the context of subjective cognitive concerns.   Assessment and Plan:   Clinical Impression(s): [name]'s pattern of performance is suggestive of neuropsychological functioning within normal limits. Subtle relative weaknesses (below average normative range) were exhibited across working memory, as well as encoding (i.e., learning) aspects of verbal memory to varying degrees. However, scores were well within what would be deemed "normal" given premorbid intellectual estimations, may represent longstanding patterns of strengths and weaknesses or normal intraindividual variability across a battery of tests, and are not cause for concern at this time. Overall, cognitive performance was appropriate across processing speed, attention/concentration, executive functioning, receptive and expressive language, visuospatial abilities, and learning and memory. [name] also denied difficulties completing instrumental activities of daily living (ADLs) independently.  More specific to memory, despite some mild variability in learning new information efficiently, [name] retained this knowledge after lengthy delays quite well, with retention rates of 100% across all memory measures. Overall, memory performance combined with intact performances across other areas of cognitive functioning is not suggestive of early-onset Alzheimer's disease. Likewise, his cognitive and behavioral profile is not suggestive of any other form of neurodegenerative illness presently.  Recommendations: Mr. Word is encouraged to attend to lifestyle factors for brain health (e.g., regular  physical exercise, good nutrition habits, regular participation in cognitively-stimulating activities, and general stress management techniques), which are likely to have benefits for both emotional adjustment and cognition. Optimal control of vascular risk factors (including safe cardiovascular exercise and adherence to dietary recommendations) is encouraged. Continued participation in activities which provide mental stimulation and social interaction is also recommended.   If interested, there are some activities which have therapeutic value and can be useful in keeping him cognitively stimulated. For suggestions, [name] is encouraged to go to the following website: https://www.[facility].org/get-to-know-barrow/centers-programs/neurorehabilitation-center/neuro-rehab-apps-and-games/ which has options, categorized by level of difficulty. It should be noted that these activities should not be viewed as a substitute for therapy.  When learning new information, he would benefit from information being broken up into small, manageable pieces. He may also find it helpful to articulate the material in his own words and in a context to promote encoding at the onset of a new task. This material may need to be repeated multiple times to promote encoding.  Memory can be improved using internal strategies such as rehearsal, repetition, chunking, mnemonics, association, and imagery. External strategies such as written notes in a consistently used memory journal, visual and nonverbal auditory cues such as a calendar on the refrigerator or appointments with alarm, such as on a cell phone, can also help maximize recall.    To address day-to-day problems with fluctuating attention, he may wish to consider:   -Avoiding external distractions when needing to concentrate   -Limiting exposure to fast paced environments with multiple sensory demands   -Writing down complicated information and using checklists   -Attempting  and completing one task at a time (i.e., no multi-tasking)   -Verbalizing aloud each step of a task to maintain focus   -Reducing the amount of information considered at one time  Review of Records:   [name] was seen by [facility] Cheryll Cockayne, M.D.) on 02/02/2020. He reported  his wife having significant concerns surrounding his cognitive functioning, primarily his memory. He denied any concerns and reported being tested for these concerns in the past. He agreed to participate in a comprehensive cognitive evaluation. As such, [name] was referred for a comprehensive neuropsychological evaluation to characterize his cognitive abilities and to assist with diagnostic clarity and treatment planning.   No neuroimaging was available for review.   [name] reported undergoing a comprehensive neuropsychological evaluation several years prior in [place], [place]. These records were unavailable for review. However, per his report, he "aced" this evaluation, yielding no areas of concern across testing.   Past Medical History:  Diagnosis Date  . Anemia due to chronic kidney disease   . Avulsion fracture of lateral epicondyle of humerus 12/27/2017  . CKD (chronic kidney disease), stage III 06/16/2017   nephrologist-  dr Ulice Dash patel  . Coronary artery calcification seen on CAT scan 02/10/2020  . ED (erectile dysfunction)   . GERD (gastroesophageal reflux disease)   . History of asthma    child  . History of colon polyps 06/16/2017   Last colonoscopy 2018 - no polyps  . History of kidney stones   . History of malignant melanoma of skin    s/p  moh's excision of forehead  . Hypertension   . Low back pain without sciatica 02/02/2020  . Patellofemoral arthritis of right knee 12/27/2017  . Renal calculus, right   . Secondary hyperparathyroidism of renal origin   . Umbilical hernia     Past Surgical History:  Procedure Laterality Date  . COLONOSCOPY  last one 11-14-2016  . EXTRACORPOREAL  SHOCK WAVE LITHOTRIPSY  2010  . EXTRACORPOREAL SHOCK WAVE LITHOTRIPSY Right 04/14/2019   Procedure: EXTRACORPOREAL SHOCK WAVE LITHOTRIPSY (ESWL);  Surgeon: Ceasar Mons, MD;  Location: [facility];  Service: Urology;  Laterality: Right;  . MOHS SURGERY  2005 approx.   melanoma of forehead  . TONSILLECTOMY  child    Current Outpatient Medications:  .  amLODipine (NORVASC) 2.5 MG tablet, TAKE 1 TABLET(2.5 MG) BY MOUTH DAILY, Disp: 90 tablet, Rfl: 1 .  ammonium lactate (AMLACTIN) 12 % lotion, Apply 1 application topically as needed for dry skin. (Patient taking differently: Apply 1 application topically as needed for dry skin (left foot). ), Disp: 400 g, Rfl: 5 .  BIOTIN MAXIMUM PO, Take by mouth., Disp: , Rfl:  .  ELDERBERRY PO, Take by mouth., Disp: , Rfl:  .  Folic Acid (FOLATE PO), Take 665 mcg by mouth., Disp: , Rfl:  .  IODINE EX, Apply topically., Disp: , Rfl:  .  Multiple Vitamins-Minerals (MULTIVITAMIN MEN 50+) TABS, Take 1 tablet by mouth daily., Disp: , Rfl:  .  olmesartan-hydrochlorothiazide (BENICAR HCT) 40-25 MG tablet, Take 1 tablet by mouth daily. (Patient taking differently: Take 1 tablet by mouth daily. ), Disp: , Rfl:  .  Pyridoxine HCl (B-6 PO), Take by mouth., Disp: , Rfl:  .  rosuvastatin (CRESTOR) 10 MG tablet, Take 1 tablet (10 mg total) by mouth daily., Disp: 90 tablet, Rfl: 3 .  Selenium 100 MCG TABS, Take by mouth., Disp: , Rfl:  .  Vitamin A 2400 MCG (8000 UT) CAPS, Take 1,200 mcg by mouth., Disp: , Rfl:  .  vitamin E (VITAMIN E) 180 MG (400 UNITS) capsule, Take 400 Units by mouth daily., Disp: , Rfl:  .  Zinc Sulfate 110 MG TABS, Take by mouth., Disp: , Rfl:   Clinical Interview:   Cognitive Symptoms: Decreased short-term memory:  Denied. He acknowledged that his wife has increased concerns surrounding memory and that she did not agree with prior cognitive testing not revealing any areas of concern. He theorized that memory lapses observed by his wife are  instances where he is not paying well enough attention.  Decreased long-term memory: Denied. Decreased attention/concentration: Denied. Reduced processing speed: Denied. Difficulties with executive functions: Denied. Personality changes were likewise denied.  Difficulties with emotion regulation: Denied. Difficulties with receptive language: Denied. Difficulties with word finding: Denied. Decreased visuoperceptual ability: Denied.  Difficulties completing ADLs: Denied.  Additional Medical History: History of traumatic brain injury/concussion: Unclear. He reported the possibility of sustaining a concussion while serving in the [employer]. However, he denied any persisting symptoms or difficulties.  History of stroke: Denied. History of seizure activity: Denied. History of known exposure to toxins: Denied. Symptoms of chronic pain: Endorsed. He reported some low back pain stemming from a compressed disc. He noted engaging in treatment with a chiropractor, which has been beneficial in managing pain symptoms.  Experience of frequent headaches/migraines: Denied. Frequent instances of dizziness/vertigo: Denied.  Sensory changes: Denied.  Balance/coordination difficulties: Denied. Other motor difficulties: Denied.  Sleep History: Estimated hours obtained each night: 7-8 hours.  Difficulties falling asleep: Endorsed. Mild difficulties were occasionally reported; however, these are improved after taking melatonin.  Difficulties staying asleep: Denied. Feels rested and refreshed upon awakening: Endorsed.  History of snoring: Denied. History of waking up gasping for air: Denied. Witnessed breath cessation while asleep: Denied.  History of vivid dreaming: Denied. Excessive movement while asleep: Denied. Instances of acting out his dreams: Denied.  Psychiatric/Behavioral Health History: Depression: Denied. He described his mood as "always positive, upbeat" and denied a history of mental health  concerns or diagnoses. He likewise denied current or remote suicidal ideation, intent, or plan.  Anxiety: Denied. Mania: Denied. Trauma History: Denied. Visual/auditory hallucinations: Denied. Delusional thoughts: Denied.  Tobacco: Denied. Alcohol: He reported consuming 1-2 glasses of wine per night. He denied a history of problematic alcohol abuse or dependence.  Recreational drugs: Denied. Caffeine: 1 Mountain Dew soda in the morning.   Family History: Problem Relation Age of Onset  . Breast cancer Mother    This information was confirmed by Mr. Wanninger.  Academic/Vocational History: Highest level of educational attainment: 16 years. Following his time spent in the [employer], he went back to college and earned a Oncologist in Patent [employer]. He described himself as an excellent (A, "top of the class") student in academic settings. Foreign languages represented a relative weakness.  History of developmental delay: Denied. History of grade repetition: Denied. Enrollment in special education courses: Denied. History of LD/ADHD: Denied.  Employment: Retired. 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 After he completed college, he built up his resume, ultimately working for [employer] in various industrial capacities for an extended period of time.   Exercise: He described himself as very physically and mentally active, including regularly completing brain games, playing chess, playing several musical instruments, and engaging in other cognitively stimulating activities.   Evaluation Results:   Behavioral Observations: Mr. Linck was unaccompanied, arrived to his appointment on time, and was appropriately dressed and groomed. He appeared alert and oriented. Observed gait and station were within normal limits. Gross motor functioning appeared intact upon informal observation and no abnormal movements (e.g., tremors) were noted. His affect was generally relaxed and  positive, but did range appropriately given the subject being discussed during the clinical interview or the task  at hand during testing procedures. Spontaneous speech was fluent and word finding difficulties were not observed during the clinical interview. Thought processes were coherent, organized, and normal in content. Insight into his cognitive difficulties appeared appropriate. During testing, sustained attention was appropriate. Task engagement was adequate and he persisted when challenged. Overall, Mr. Monica MartinezRemy was cooperative with the clinical interview and subsequent testing procedures.   Adequacy of Effort: The validity of neuropsychological testing is limited by the extent to which the individual being tested may be assumed to have exerted adequate effort during testing. Mr. Monica MartinezRemy expressed his intention to perform to the best of his abilities and exhibited adequate task engagement and persistence. Scores across stand-alone and embedded performance validity measures were within expectation. As such, the results of the current evaluation are believed to be a valid representation of Mr. Viann ShoveRemy's current cognitive functioning.  Test Results: Mr. Monica MartinezRemy was fully oriented at the time of the current evaluation.  Intellectual abilities based upon educational and vocational attainment were estimated to be in the average range. Premorbid abilities were estimated to be within the average range based upon a single-word reading test.   Processing speed was average to above average. Basic attention was average. More complex attention (e.g., working memory) was below average. Executive functioning was average to exceptionally high.  Assessed receptive language abilities were average. Likewise, Mr. Monica MartinezRemy did not exhibit any difficulties comprehending task instructions and answered all questions asked of him appropriately. Sentence repetition was average, while performance on a semantic knowledge screening test was  within normal limits. Assessed expressive language (e.g., verbal fluency and confrontation naming) was average to well above average.     Assessed visuospatial/visuoconstructional abilities were above average to well above average.    Learning (i.e., encoding) of novel verbal and visual information was below average to average. Spontaneous delayed recall (i.e., retrieval) of previously learned information was average to above average. Retention rates were 100% across a story learning task, 100% across a list learning task, and 100% across a shape learning task. Performance across recognition tasks was appropriate, suggesting evidence for information consolidation.   Results of emotional screening instruments suggested that recent symptoms of generalized anxiety were in the minimal range, while symptoms of depression were within normal limits. A screening instrument assessing recent sleep quality suggested the presence of minimal sleep dysfunction.  Tables of Scores:   Note: This summary of test scores accompanies the interpretive report and should not be considered in isolation without reference to the appropriate sections in the text. Descriptors are based on appropriate normative data and may be adjusted based on clinical judgment. The terms "impaired" and "within normal limits (WNL)" are used when a more specific level of functioning cannot be determined.       Effort Testing:   DESCRIPTOR       ACS Word Choice: --- --- Within Expectation  HVLT-R Recognition Discrimination Index: --- --- Within Expectation  BVMT-R Retention Percentage: --- --- Within Expectation  D-KEFS Color Word Effort Index: --- --- Within Expectation       Orientation:      Raw Score Percentile   NAB Orientation, Form 1 29/29 --- ---       Intellectual Functioning:           Standard Score Percentile   Test of Premorbid Functioning: 106 66 Average       Memory:          Wechsler Memory Scale (WMS-IV):  Raw Score (Scaled Score) Percentile     Logical Memory I 29/53 (9) 37 Average    Logical Memory II 22/39 (12) 75 Above Average    Logical Memory Recognition 17/23 17-25 Below Average       Hopkins Verbal Learning Test (HVLT-R), Form 1: Raw Score (T Score) Percentile     Total Trials 1-3 23/36 (42) 21 Below Average    Delayed Recall 9/12 (47) 38 Average    Recognition Discrimination Index 10 (45) 31 Average      True Positives 11 --- ---      False Positives 1 --- ---       Brief Visuospatial Memory Test (BVMT-R), Form 1: Raw Score (T Score) Percentile     Total Trials 1-3 19/36 (45) 31 Average    Delayed Recall 9/12 (53) 62 Average    Recognition Discrimination Index 5 11-16 Below Average      Recognition Hits 5/6 11-16 Below Average      False Positive Errors 0 >16 Within Normal Limits        Attention/Executive Function:          Trail Making Test (TMT): Raw Score (T Score) Percentile     Part A 36 secs.,  0 errors (46) 34 Average    Part B 72 secs.,  0 errors (53) 62 Average         Scaled Score Percentile   WAIS-IV Coding: 11 63 Average       NAB Attention Module, Form 1: T Score Percentile     Digits Forward 49 46 Average    Digits Backwards 39 14 Below Average       D-KEFS Color-Word Interference Test: Raw Score (Scaled Score) Percentile     Color Naming 28 secs. (12) 75 Above Average    Word Reading 19 secs. (13) 84 Above Average    Inhibition 40 secs. (15) 95 Well Above Average      Total Errors 0 errors (12) 75 Above Average    Inhibition/Switching 45 secs. (15) 95 Well Above Average      Total Errors 0 errors (13) 84 Above Average       D-KEFS Verbal Fluency Test: Raw Score (Scaled Score) Percentile     Letter Total Correct 46 (13) 84 Above Average    Category Total Correct 46 (14) 91 Above Average    Category Switching Total Correct 19 (18) >99 Exceptionally High    Category Switching Accuracy 18 (17) 99 Exceptionally High      Total Set Loss  Errors 1 (11) 63 Average      Total Repetition Errors 2 (11) 63 Average       D-KEFS 20 Questions Test: Scaled Score Percentile     Total Weighted Achievement Score 15 95 Well Above Average    Initial Abstraction Score 10 50 Average       Wisconsin Card Sorting Test [facility]): Raw Score Percentile     Categories (trials) 3 (64) >16 Within Normal Limits    Total Errors 18 46 Average    Perseverative Errors 9 34 Average    Non-Perseverative Errors 9 25 Average    Failure to Maintain Set 1 --- ---       Language:           Raw Score Percentile   PPVT Screening Instrument: 16/16 --- Within Expectation  Sentence Repetition: 16/22 73 Average       Verbal Fluency Test: Raw Score (T Score) Percentile  Phonemic Fluency (FAS) 46 (55) 69 Average    Animal Fluency 29 (67) 96 Well Above Average             NAB Language Module, Form 1: T Score Percentile     Auditory Comprehension 55 69 Average    Naming 30/31 (54) 66 Average       Visuospatial/Visuoconstruction:      Raw Score Percentile   Clock Drawing: 9/10 --- Within Normal Limits       NAB Spatial Module, Form 1: T Score Percentile     Figure Drawing Copy 64 92 Well Above Average        Scaled Score Percentile   WAIS-IV Block Design: 13 84 Above Average  WAIS-IV Matrix Reasoning: 13 84 Above Average       Mood and Personality:      Raw Score Percentile   Geriatric Depression Scale: 2 --- Within Normal Limits  Geriatric Anxiety Scale: 6 --- Minimal    Somatic 3 --- Minimal    Cognitive 2 --- Minimal    Affective 1 --- Minimal       Additional Questionnaires:      Raw Score Percentile   PROMIS Sleep Disturbance Questionnaire: 12 --- None to Slight   Informed Consent and Coding/Compliance:   Mr. Vanover was provided with a verbal description of the nature and purpose of the present neuropsychological evaluation. Also reviewed were the foreseeable risks and/or discomforts and benefits of the procedure, limits of  confidentiality, and mandatory reporting requirements of this provider. The patient was given the opportunity to ask questions and receive answers about the evaluation. Oral consent to participate was provided by the patient.   This evaluation was conducted by Newman Nickels, Ph.D., licensed clinical neuropsychologist. Mr. Renteria completed a comprehensive clinical interview with Dr. Milbert Coulter, billed as one unit [phone], and 125 minutes of cognitive testing and scoring, billed as one unit [phone] and three additional units 96139. Psychometrist Wallace Keller, B.S., assisted Dr. Milbert Coulter with test administration and scoring procedures. As a separate and discrete service, Dr. Milbert Coulter spent a total of 180 minutes in interpretation and report writing billed as one unit [phone] and two units 96133.

## 2020-04-19 NOTE — Progress Notes (Signed)
   Psychometrician Note   Cognitive testing was administered to Joseph Oconnell by Wallace Keller, B.S. (psychometrist) under the supervision of Dr. Newman Nickels, Ph.D., licensed psychologist on @DATE @. Joseph Oconnell did not appear overtly distressed by the testing session per behavioral observation or responses across self-report questionnaires. Dr. Monica Martinez, Ph.D. checked in with Joseph Oconnell as needed to manage any distress related to testing procedures (if applicable). Rest breaks were offered.    The battery of tests administered was selected by Dr. Monica Martinez, Ph.D. with consideration to Joseph Oconnell's current level of functioning, the nature of his symptoms, emotional and behavioral responses during interview, level of literacy, observed level of motivation/effort, and the nature of the referral question. This battery was communicated to the psychometrist. Communication between Dr. Newman Nickels, Ph.D. and the psychometrist was ongoing throughout the evaluation and Dr. Newman Nickels, Ph.D. was immediately accessible at all times. Dr. Newman Nickels, Ph.D. provided supervision to the psychometrist on the date of this service to the extent necessary to assure the quality of all services provided.    Joseph Oconnell will return within approximately 1-2 weeks for an interactive feedback session with Dr. [name] at which time his test performances, clinical impressions, and treatment recommendations will be reviewed in detail. Joseph Oconnell understands he can contact our office should he require our assistance before this time.  A total of 125 minutes of billable time were spent face-to-face with Joseph Oconnell by the psychometrist. This includes both test administration and scoring time. Billing for these services is reflected in the clinical report generated by Dr. Monica Martinez, Ph.D..  This note reflects time spent with the psychometrician and does not include test scores or any clinical interpretations made by  Dr. Newman Nickels. The full report will follow in a separate note.

## 2020-04-26 ENCOUNTER — Ambulatory Visit (INDEPENDENT_AMBULATORY_CARE_PROVIDER_SITE_OTHER): Payer: Medicare Other | Admitting: Psychology

## 2020-04-26 ENCOUNTER — Encounter: Payer: Self-pay | Admitting: Psychology

## 2020-04-26 ENCOUNTER — Other Ambulatory Visit: Payer: Self-pay

## 2020-04-26 DIAGNOSIS — R4189 Other symptoms and signs involving cognitive functions and awareness: Secondary | ICD-10-CM

## 2020-04-26 NOTE — Progress Notes (Signed)
   Neuropsychology Feedback Session Eligha Bridegroom. [facility] [place] Department of Neurology  Reason for Referral:   Eames Dibiasio a 64 y.o. right-handed Caucasian male referred by Cheryll Cockayne, M.D.,to characterize hiscurrent cognitive functioning and assist with diagnostic clarity and treatment planning in the context of subjective cognitive concerns.   Feedback:   Mr. Axel completed a comprehensive neuropsychological evaluation on 04/19/2020. Please refer to that encounter for the full report and recommendations. Briefly, results suggested neuropsychological functioning within normal limits. Subtle relative weaknesses (below average normative range) were exhibited across working memory, as well as encoding (i.e., learning) aspects of verbal memory to varying degrees. However, scores were well within what would be deemed "normal" given premorbid intellectual estimations, may represent longstanding patterns of strengths and weaknesses or normal intraindividual variability across a battery of tests, and are not cause for concern at this time.  Mr. Cazarez was accompanied by his wife during the current telephone call. Content of the current session focused on the results of his neuropsychological evaluation. Mr. Stanek and his wife were given the opportunity to ask questions and their questions were answered. They was encouraged to reach out should additional questions arise. A copy of his report is available to him on MyChart.     Less than 16 minutes were spent conducting the current feedback session with Mr. Vangorder.

## 2020-07-22 IMAGING — CT CT HEART SCORING
2 series · 16 of 20 positions shown, 18 images · non-contrast
Comparison: None.

Addendum:
CLINICAL DATA: Risk stratification

EXAM:
Coronary Calcium Score
TECHNIQUE: The patient was scanned on a Siemens Somatom 64 slice scanner. Axial
non-contrast 3mm slices were carried out through the heart. The data
set was analyzed on a dedicated work station and scored using the
Agatson method.

[Series 3: casc 3.0 i36f 2 bestdiast 63 % · axial · 0.30mm/px · z∈[+1200,+1290]mm · 8 of 40 slices shown, 10 images]
[im 5/40  vessel]
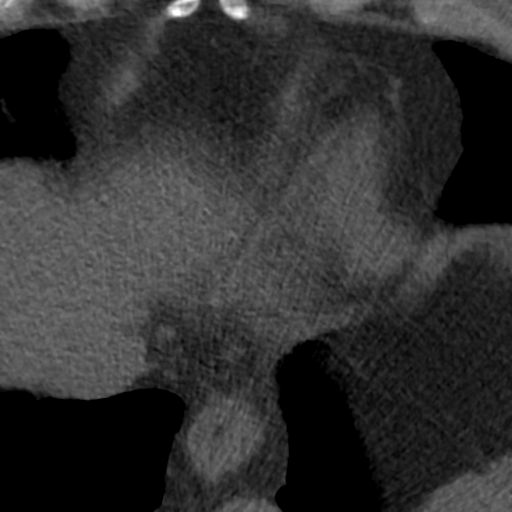
[im 5/40  lung]
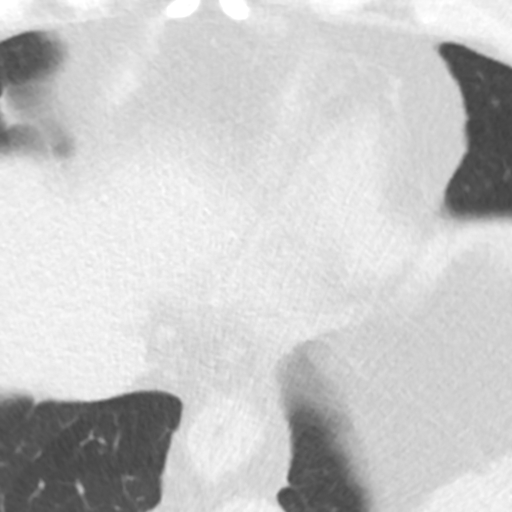
[im 9/40  vessel]
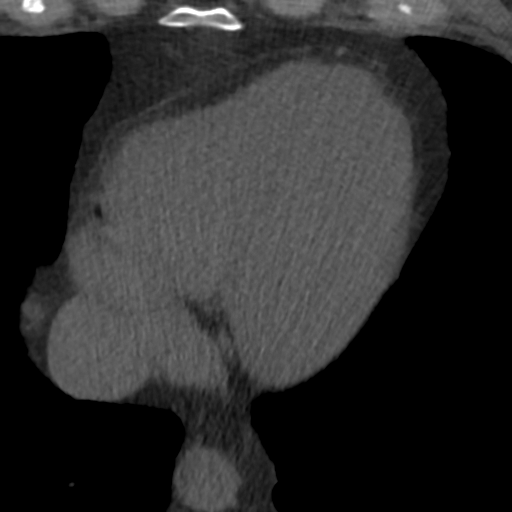
[im 14/40  vessel]
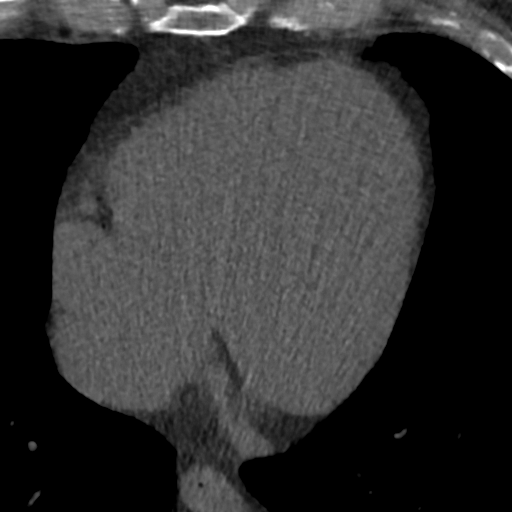
[im 18/40  vessel]
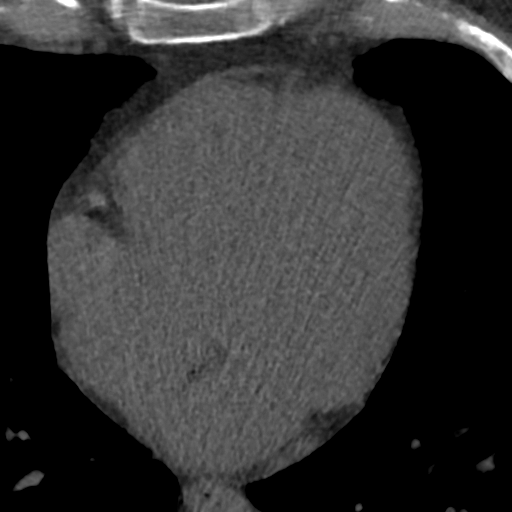
[im 22/40  vessel]
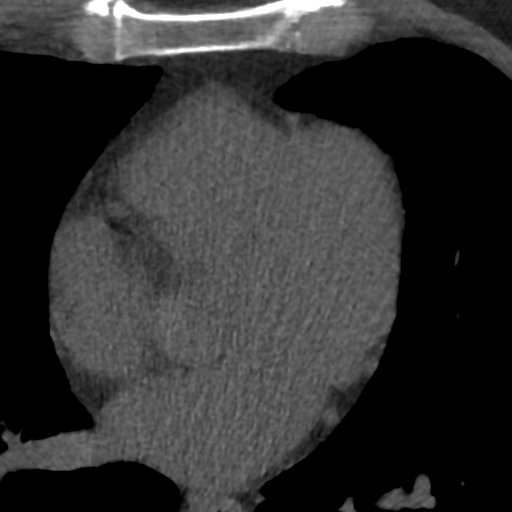
[im 22/40  lung]
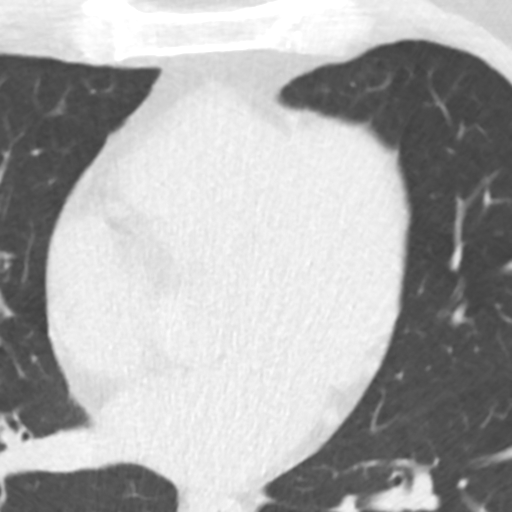
[im 27/40  vessel]
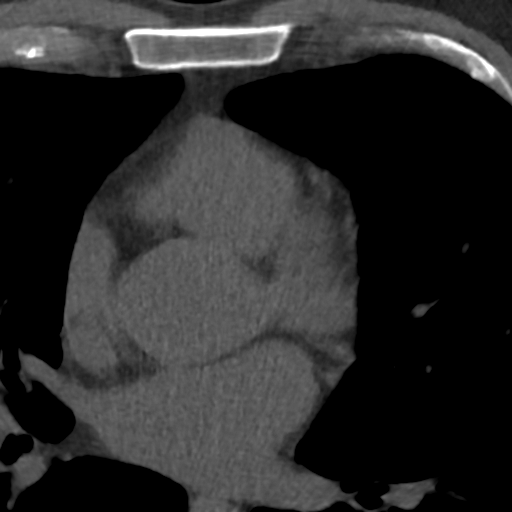
[im 31/40  vessel]
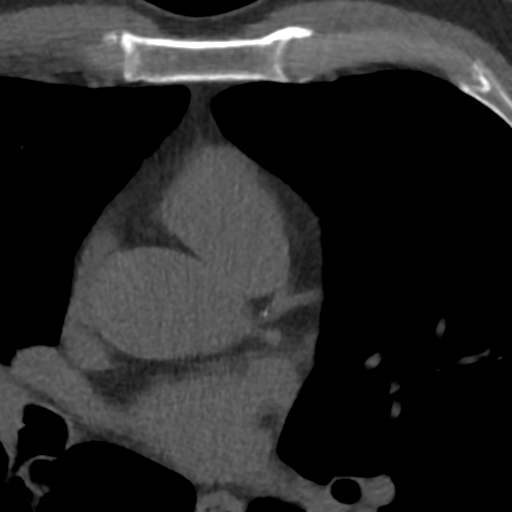
[im 35/40  vessel]
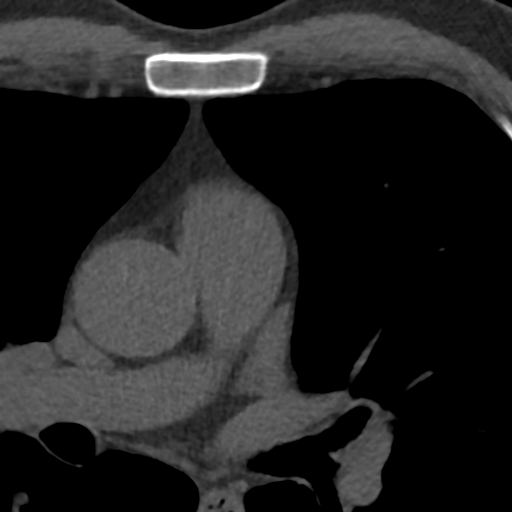

[Series 5: lung st 65 % · axial · 0.68mm/px · z∈[+1198,+1290]mm · 8 of 41 slices shown]
[im 5/41  lung]
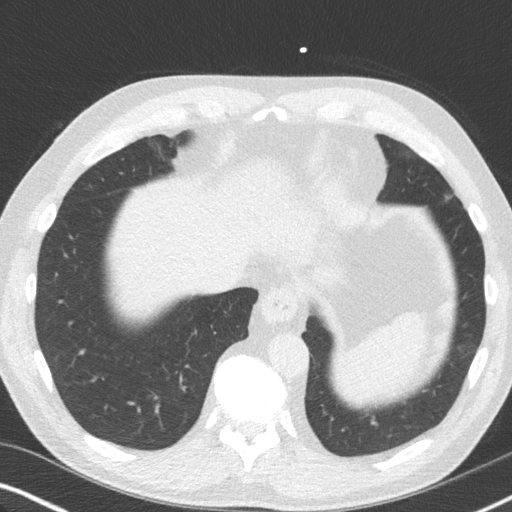
[im 9/41  lung]
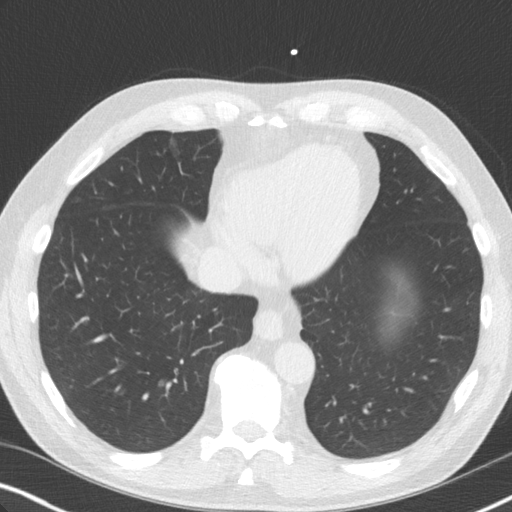
[im 14/41  lung]
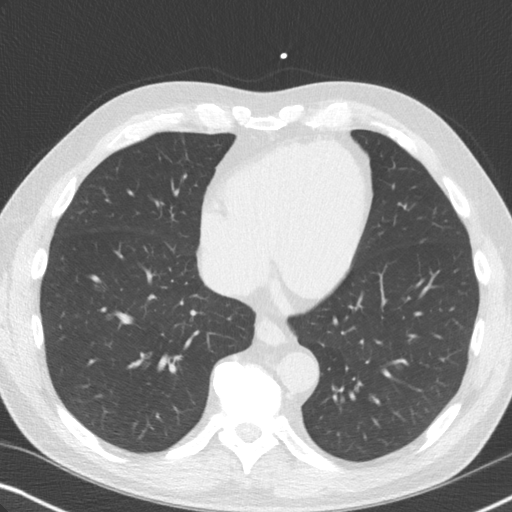
[im 18/41  lung]
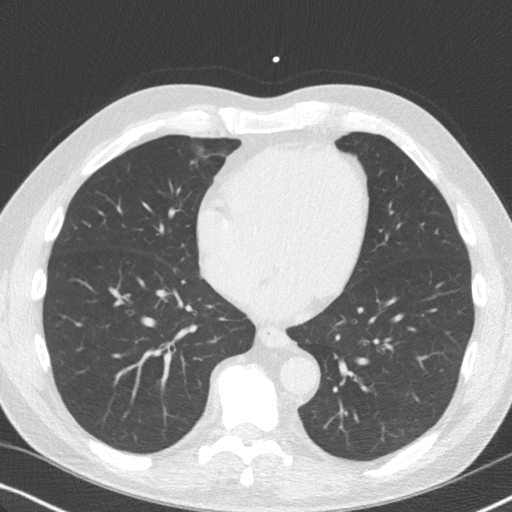
[im 23/41  lung]
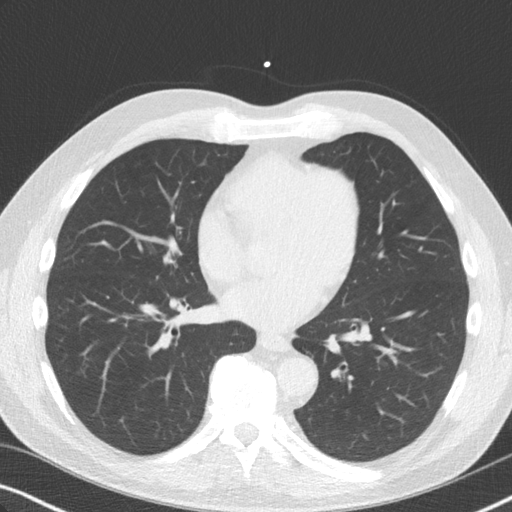
[im 27/41  lung]
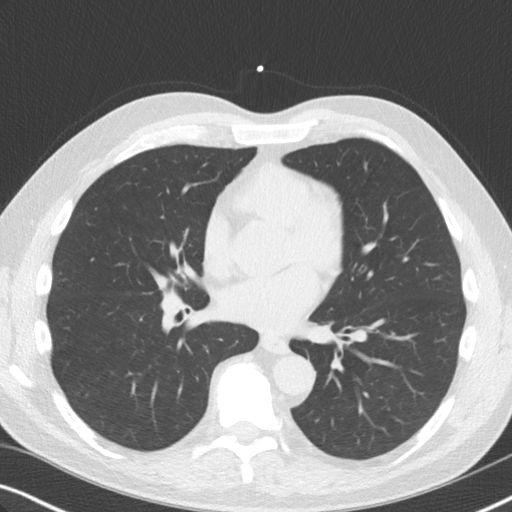
[im 32/41  lung]
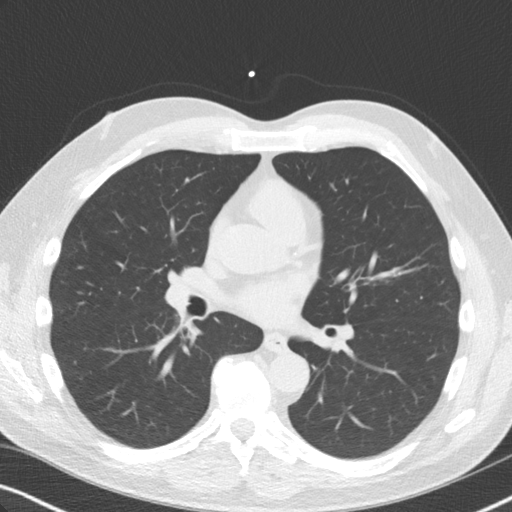
[im 36/41  lung]
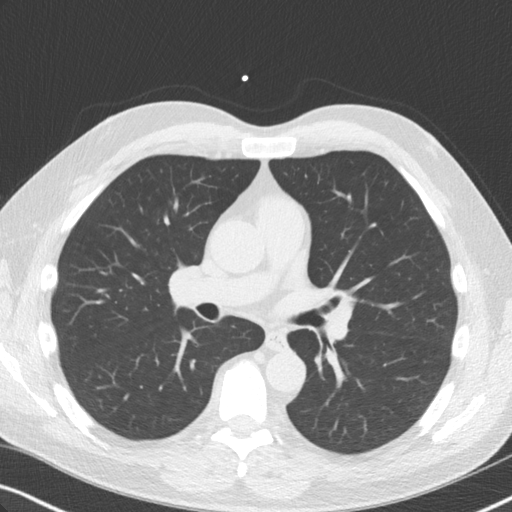

[16 of 20 positions shown; findings below may reference images not displayed]

FINDINGS: Non-cardiac: No significant non cardiac findings on limited lung and
soft tissue windows. See separate report from [REDACTED].

Ascending Aorta: Normal diameter 3.6 cm

Pericardium: Normal

Coronary arteries: Punctate calcium noted in proximal LAD/Circumflex
and distal RCA
IMPRESSION: Coronary calcium score of 18. This was 37 th percentile for age and
sex matched control.

Martha Joshua

EXAM:
OVER-READ INTERPRETATION  CT CHEST

The following report is an over-read performed by radiologist Dr.
does not include interpretation of cardiac or coronary anatomy or
pathology. The cardiac/coronary CT interpretation by the
cardiologist is attached.
FINDINGS: The visualized portions of the lower lung fields show no suspicious
nodules, masses, or infiltrates. The visualized portions of the
mediastinum and chest wall are unremarkable.
IMPRESSION: No significant non-cardiac abnormality in visualized portion of the
thorax.

*** End of Addendum ***

## 2020-08-02 ENCOUNTER — Encounter: Payer: Self-pay | Admitting: Internal Medicine

## 2020-08-02 NOTE — Progress Notes (Signed)
Subjective:    Patient ID: Joseph Oconnell, male    DOB: 07/08/1956, 64 y.o.   MRN: [id number]  HPI The patient is here for follow up of their chronic medical problems, including CKD, htn, hyperlipidemia   He still has lower back issues.  He has "compressed discs" and sees chiropractor once a week. He has seen orthopedics for his back earlier this year.   He is exercising regularly - he is on the elliptical daily.  He is eating healthy.  He drinks more water.       Overall he feels well and has no concerns.   Medications and allergies reviewed with patient and updated if appropriate.  Patient Active Problem List   Diagnosis Date Noted  . Coronary artery calcification seen on CAT scan 02/10/2020  . Low back pain without sciatica 02/02/2020  . Kidney stone 01/30/2020  . Dry skin dermatitis 01/01/2019  . Umbilical hernia 11/22/2017  . Chronic kidney disease 06/16/2017  . History of colon polyps 06/16/2017  . History of melanoma 06/15/2017  . Hypertension 06/15/2017    Current Outpatient Medications on File Prior to Visit  Medication Sig Dispense Refill  . amLODipine (NORVASC) 2.5 MG tablet TAKE 1 TABLET(2.5 MG) BY MOUTH DAILY 90 tablet 1  . ammonium lactate (AMLACTIN) 12 % lotion Apply 1 application topically as needed for dry skin. (Patient taking differently: Apply 1 application topically as needed for dry skin (left foot). ) 400 g 5  . BIOTIN MAXIMUM PO Take by mouth.    . ELDERBERRY PO Take by mouth.    . Folic Acid (FOLATE PO) Take 665 mcg by mouth.    . IODINE EX Apply topically.    . Multiple Vitamins-Minerals (MULTIVITAMIN MEN 50+) TABS Take 1 tablet by mouth daily.    Marland Kitchen olmesartan-hydrochlorothiazide (BENICAR HCT) 40-25 MG tablet Take 1 tablet by mouth daily. (Patient taking differently: Take 1 tablet by mouth daily. )    . Pyridoxine HCl (B-6 PO) Take by mouth.    . rosuvastatin (CRESTOR) 10 MG tablet Take 1 tablet (10 mg total) by mouth daily. 90 tablet 3  . Selenium  100 MCG TABS Take by mouth.    . Vitamin A 2400 MCG (8000 UT) CAPS Take 1,200 mcg by mouth.    . vitamin E (VITAMIN E) 180 MG (400 UNITS) capsule Take 400 Units by mouth daily.    . Zinc Sulfate 110 MG TABS Take by mouth.     No current facility-administered medications on file prior to visit.    Past Medical History:  Diagnosis Date  . Anemia due to chronic kidney disease   . Avulsion fracture of lateral epicondyle of humerus 12/27/2017  . CKD (chronic kidney disease), stage III 06/16/2017   nephrologist-  dr Vonna Kotyk patel  . Coronary artery calcification seen on CAT scan 02/10/2020  . ED (erectile dysfunction)   . GERD (gastroesophageal reflux disease)   . History of asthma    child  . History of colon polyps 06/16/2017   Last colonoscopy 2018 - no polyps  . History of kidney stones   . History of malignant melanoma of skin    s/p  moh's excision of forehead  . Hypertension   . Low back pain without sciatica 02/02/2020  . Patellofemoral arthritis of right knee 12/27/2017  . Renal calculus, right   . Secondary hyperparathyroidism of renal origin   . Umbilical hernia     Past Surgical History:  Procedure Laterality Date  .  COLONOSCOPY  last one 11-14-2016  . EXTRACORPOREAL SHOCK WAVE LITHOTRIPSY  2010  . EXTRACORPOREAL SHOCK WAVE LITHOTRIPSY Right 04/14/2019   Procedure: EXTRACORPOREAL SHOCK WAVE LITHOTRIPSY (ESWL);  Surgeon: Rene Paci, MD;  Location: [facility];  Service: Urology;  Laterality: Right;  . MOHS SURGERY  2005 approx.   melanoma of forehead  . TONSILLECTOMY  child    Social History   Socioeconomic History  . Marital status: Married    Spouse name: Diane  . Number of children: 0  . Years of education: 16  . Highest education level: Bachelor's degree (e.g., BA, AB, BS)  Occupational History  . Not on file  Tobacco Use  . Smoking status: Never Smoker  . Smokeless tobacco: Never Used  Vaping Use  . Vaping Use: Never used  Substance and Sexual Activity   . Alcohol use: Yes    Comment: 1-2 glasses of wine per day  . Drug use: Never  . Sexual activity: Not on file  Other Topics Concern  . Not on file  Social History Narrative  . Not on file   Social Determinants of Health   Financial Resource Strain:   . Difficulty of Paying Living Expenses: Not on file  Food Insecurity:   . Worried About Programme researcher, broadcasting/film/video in the Last Year: Not on file  . Ran Out of Food in the Last Year: Not on file  Transportation Needs:   . Lack of Transportation (Medical): Not on file  . Lack of Transportation (Non-Medical): Not on file  Physical Activity:   . Days of Exercise per Week: Not on file  . Minutes of Exercise per Session: Not on file  Stress:   . Feeling of Stress : Not on file  Social Connections:   . Frequency of Communication with Friends and Family: Not on file  . Frequency of Social Gatherings with Friends and Family: Not on file  . Attends Religious Services: Not on file  . Active Member of Clubs or Organizations: Not on file  . Attends Banker Meetings: Not on file  . Marital Status: Not on file    Family History  Problem Relation Age of Onset  . Breast cancer Mother     Review of Systems  Constitutional: Negative for chills and fever.  Respiratory: Negative for cough, shortness of breath and wheezing.   Cardiovascular: Negative for chest pain, palpitations and leg swelling.  Neurological: Negative for light-headedness and headaches.       Objective:   Vitals:   08/03/20 1114  BP: 120/72  Pulse: 68  SpO2: 98%   BP Readings from Last 3 Encounters:  08/03/20 120/72  02/02/20 128/70  04/14/19 106/80   Wt Readings from Last 3 Encounters:  08/03/20 188 lb (85.3 kg)  02/02/20 206 lb 12.8 oz (93.8 kg)  04/14/19 199 lb 15.3 oz (90.7 kg)   Body mass index is 25.5 kg/m.   Physical Exam    Constitutional: Appears well-developed and well-nourished. No distress.  HENT:  Head: Normocephalic and atraumatic.   Neck: Neck supple. No tracheal deviation present. No thyromegaly present.  No cervical lymphadenopathy Cardiovascular: Normal rate, regular rhythm and normal heart sounds.   No murmur heard. No carotid bruit .  No edema Pulmonary/Chest: Effort normal and breath sounds normal. No respiratory distress. No has no wheezes. No rales.  Skin: Skin is warm and dry. Not diaphoretic.  Psychiatric: Normal mood and affect. Behavior is normal.      Assessment &  Plan:    See Problem List for Assessment and Plan of chronic medical problems.    This visit occurred during the SARS-CoV-2 public health emergency.  Safety protocols were in place, including screening questions prior to the visit, additional usage of staff PPE, and extensive cleaning of exam room while observing appropriate contact time as indicated for disinfecting solutions.

## 2020-08-02 NOTE — Patient Instructions (Addendum)
  Blood work was ordered.     Medications reviewed and updated.  Changes include :  Change Olmesartan-hctz from 40-25 mg daily to 40-12.5 mg daily     Your prescription(s) have been submitted to your pharmacy. Please take as directed and contact our office if you believe you are having problem(s) with the medication(s).    Please followup in 6 months

## 2020-08-03 ENCOUNTER — Ambulatory Visit (INDEPENDENT_AMBULATORY_CARE_PROVIDER_SITE_OTHER): Payer: Medicare Other | Admitting: Internal Medicine

## 2020-08-03 ENCOUNTER — Other Ambulatory Visit: Payer: Self-pay

## 2020-08-03 ENCOUNTER — Encounter: Payer: Self-pay | Admitting: Internal Medicine

## 2020-08-03 VITALS — BP 120/72 | HR 68 | Ht 72.0 in | Wt 188.0 lb

## 2020-08-03 DIAGNOSIS — I2510 Atherosclerotic heart disease of native coronary artery without angina pectoris: Secondary | ICD-10-CM | POA: Diagnosis not present

## 2020-08-03 DIAGNOSIS — I10 Essential (primary) hypertension: Secondary | ICD-10-CM | POA: Diagnosis not present

## 2020-08-03 DIAGNOSIS — M545 Low back pain, unspecified: Secondary | ICD-10-CM

## 2020-08-03 DIAGNOSIS — N1831 Chronic kidney disease, stage 3a: Secondary | ICD-10-CM

## 2020-08-03 DIAGNOSIS — G8929 Other chronic pain: Secondary | ICD-10-CM

## 2020-08-03 MED ORDER — AMLODIPINE BESYLATE 2.5 MG PO TABS
ORAL_TABLET | ORAL | 3 refills | Status: DC
Start: 2020-08-03 — End: 2021-09-01

## 2020-08-03 MED ORDER — ROSUVASTATIN CALCIUM 10 MG PO TABS: 10.0000 mg | ORAL_TABLET | Freq: Every day | ORAL | 3 refills | Status: AC

## 2020-08-03 MED ORDER — OLMESARTAN MEDOXOMIL-HCTZ 40-12.5 MG PO TABS: 1.0000 | ORAL_TABLET | Freq: Every day | ORAL | 1 refills | Status: DC

## 2020-08-03 NOTE — Assessment & Plan Note (Signed)
Chronic Still dealing with back pain and sees a chiropractor once a week Advised that he can follow-up with orthopedics-MRI may be necessary

## 2020-08-03 NOTE — Assessment & Plan Note (Signed)
Chronic Drinking lots of water Avoids nsaids cmp

## 2020-08-03 NOTE — Addendum Note (Signed)
Addended by: Carlyon Prows on: 08/03/2020 12:03 PM   Modules accepted: Orders

## 2020-08-03 NOTE — Assessment & Plan Note (Addendum)
Chronic Mild by coronary artery calcium ct Taking crestor daily Check lipids, cmp, high CRP-this was elevated with recent blood work from [facility]

## 2020-08-03 NOTE — Assessment & Plan Note (Signed)
Chronic BP well controlled Current regimen effective and well tolerated Continue current medications at current doses cmp  

## 2020-08-04 LAB — COMPLETE METABOLIC PANEL WITH GFR
AG Ratio: 1.8 (calc) (ref 1.0–2.5)
ALT: 19 U/L (ref 9–46)
AST: 23 U/L (ref 10–35)
Albumin: 4.6 g/dL (ref 3.6–5.1)
Alkaline phosphatase (APISO): 63 U/L (ref 35–144)
BUN/Creatinine Ratio: 19 (calc) (ref 6–22)
BUN: 26 mg/dL — ABNORMAL HIGH (ref 7–25)
CO2: 26 mmol/L (ref 20–32)
Calcium: 9.6 mg/dL (ref 8.6–10.3)
Chloride: 100 mmol/L (ref 98–110)
Creat: 1.36 mg/dL — ABNORMAL HIGH (ref 0.70–1.25)
GFR, Est African American: 63 mL/min/{1.73_m2} (ref >=60)
GFR, Est Non African American: 54 mL/min/{1.73_m2} — ABNORMAL LOW (ref >=60)
Globulin: 2.6 g/dL (calc) (ref 1.9–3.7)
Glucose, Bld: 102 mg/dL — ABNORMAL HIGH (ref 65–99)
Potassium: 4.4 mmol/L (ref 3.5–5.3)
Sodium: 137 mmol/L (ref 135–146)
Total Bilirubin: 1.2 mg/dL (ref 0.2–1.2)
Total Protein: 7.2 g/dL (ref 6.1–8.1)

## 2020-08-04 LAB — LIPID PANEL
Cholesterol: 172 mg/dL (ref <200)
HDL: 46 mg/dL (ref >=40)
LDL Cholesterol (Calc): 102 mg/dL (calc) — ABNORMAL HIGH
Non-HDL Cholesterol (Calc): 126 mg/dL (calc) (ref <130)
Total CHOL/HDL Ratio: 3.7 (calc) (ref <5.0)
Triglycerides: 138 mg/dL (ref <150)

## 2020-08-04 LAB — HIGH SENSITIVITY CRP: hs-CRP: 1.8 mg/L

## 2021-01-20 ENCOUNTER — Other Ambulatory Visit: Payer: Self-pay | Admitting: Internal Medicine

## 2021-02-03 ENCOUNTER — Ambulatory Visit: Payer: Medicare Other | Admitting: Internal Medicine

## 2021-03-02 ENCOUNTER — Other Ambulatory Visit: Payer: Self-pay | Admitting: Internal Medicine

## 2021-08-31 ENCOUNTER — Other Ambulatory Visit: Payer: Self-pay | Admitting: Internal Medicine

## 2022-09-28 ENCOUNTER — Other Ambulatory Visit: Payer: Self-pay | Admitting: Internal Medicine
